# Patient Record
Sex: Female | Born: 1955
Health system: Southern US, Community
[De-identification: ages and names within clinical notes are randomized; demographics above are authoritative.]

## PROBLEM LIST (undated history)

## (undated) DIAGNOSIS — K219 Gastro-esophageal reflux disease without esophagitis: Secondary | ICD-10-CM

## (undated) DIAGNOSIS — E785 Hyperlipidemia, unspecified: Secondary | ICD-10-CM

## (undated) HISTORY — DX: Gastro-esophageal reflux disease without esophagitis: K21.9

## (undated) HISTORY — DX: Hyperlipidemia, unspecified: E78.5

---
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
## 2005-07-06 ENCOUNTER — Encounter: Payer: Self-pay | Admitting: Physician Assistant

## 2008-09-22 LAB — HM COLONOSCOPY

## 2017-02-15 ENCOUNTER — Ambulatory Visit (INDEPENDENT_AMBULATORY_CARE_PROVIDER_SITE_OTHER): Payer: BLUE CROSS/BLUE SHIELD | Admitting: Physician Assistant

## 2017-02-15 ENCOUNTER — Encounter: Payer: Self-pay | Admitting: Physician Assistant

## 2017-02-15 VITALS — BP 110/74 | HR 61 | Temp 98.6°F | Ht 61.5 in | Wt 139.5 lb

## 2017-02-15 DIAGNOSIS — M25562 Pain in left knee: Secondary | ICD-10-CM

## 2017-02-15 DIAGNOSIS — M25561 Pain in right knee: Secondary | ICD-10-CM

## 2017-02-15 DIAGNOSIS — K2970 Gastritis, unspecified, without bleeding: Secondary | ICD-10-CM | POA: Diagnosis not present

## 2017-02-15 DIAGNOSIS — G8929 Other chronic pain: Secondary | ICD-10-CM | POA: Diagnosis not present

## 2017-02-15 DIAGNOSIS — E559 Vitamin D deficiency, unspecified: Secondary | ICD-10-CM | POA: Diagnosis not present

## 2017-02-15 DIAGNOSIS — R5383 Other fatigue: Secondary | ICD-10-CM

## 2017-02-15 DIAGNOSIS — L814 Other melanin hyperpigmentation: Secondary | ICD-10-CM | POA: Insufficient documentation

## 2017-02-15 DIAGNOSIS — F320 Major depressive disorder, single episode, mild: Secondary | ICD-10-CM | POA: Diagnosis not present

## 2017-02-15 DIAGNOSIS — Z79899 Other long term (current) drug therapy: Secondary | ICD-10-CM

## 2017-02-15 LAB — COMPREHENSIVE METABOLIC PANEL
ALT: 9 U/L (ref 0–35)
AST: 12 U/L (ref 0–37)
Albumin: 4.5 g/dL (ref 3.5–5.2)
Alkaline Phosphatase: 61 U/L (ref 39–117)
BUN: 15 mg/dL (ref 6–23)
CALCIUM: 10 mg/dL (ref 8.4–10.5)
CHLORIDE: 104 meq/L (ref 96–112)
CO2: 29 meq/L (ref 19–32)
Creatinine, Ser: 0.65 mg/dL (ref 0.40–1.20)
GFR: 98.27 mL/min (ref >60.00)
GLUCOSE: 98 mg/dL (ref 70–99)
POTASSIUM: 5.1 meq/L (ref 3.5–5.1)
Sodium: 141 mEq/L (ref 135–145)
Total Bilirubin: 0.7 mg/dL (ref 0.2–1.2)
Total Protein: 6.8 g/dL (ref 6.0–8.3)

## 2017-02-15 LAB — CBC WITH DIFFERENTIAL/PLATELET
BASOS ABS: 0 10*3/uL (ref 0.0–0.1)
BASOS PCT: 0.7 % (ref 0.0–3.0)
EOS PCT: 6.8 % — AB (ref 0.0–5.0)
Eosinophils Absolute: 0.4 10*3/uL (ref 0.0–0.7)
HEMATOCRIT: 41.8 % (ref 36.0–46.0)
Hemoglobin: 14.1 g/dL (ref 12.0–15.0)
LYMPHS ABS: 2.1 10*3/uL (ref 0.7–4.0)
LYMPHS PCT: 35 % (ref 12.0–46.0)
MCHC: 33.7 g/dL (ref 30.0–36.0)
MCV: 94 fl (ref 78.0–100.0)
MONOS PCT: 5.8 % (ref 3.0–12.0)
Monocytes Absolute: 0.3 10*3/uL (ref 0.1–1.0)
NEUTROS ABS: 3.1 10*3/uL (ref 1.4–7.7)
NEUTROS PCT: 51.7 % (ref 43.0–77.0)
PLATELETS: 234 10*3/uL (ref 150.0–400.0)
RBC: 4.45 Mil/uL (ref 3.87–5.11)
RDW: 13.1 % (ref 11.5–15.5)
WBC: 6 10*3/uL (ref 4.0–10.5)

## 2017-02-15 LAB — VITAMIN D 25 HYDROXY (VIT D DEFICIENCY, FRACTURES): VITD: 31.64 ng/mL (ref 30.00–100.00)

## 2017-02-15 LAB — TSH: TSH: 1.51 u[IU]/mL (ref 0.35–4.50)

## 2017-02-15 LAB — T4, FREE: Free T4: 0.68 ng/dL (ref 0.60–1.60)

## 2017-02-15 LAB — VITAMIN B12: VITAMIN B 12: 609 pg/mL (ref 211–911)

## 2017-02-15 MED ORDER — FAMOTIDINE 20 MG PO TABS: 20.0000 mg | ORAL_TABLET | Freq: Every day | ORAL | 1 refills | Status: DC

## 2017-02-15 NOTE — Progress Notes (Signed)
Tracey Moody is a 61 y.o. female here to Establish Care  I acted as a Neurosurgeonscribe for [employer] CorporationSamantha Catricia Scheerer, PA-C Corky Mullonna Orphanos, LPN  History of Present Illness:   Chief Complaint  Patient presents with  . Establish Care    BC/BS  . Fatigue  . Bilateral Knee pain  . check brown spots on skin    Acute Concerns: Fatigue -- patient reports significant fatigue since moving here last fall. She is working 50+ hours a week. Does have a significant hx of depression, which she reports that she recently resumed her 20 mg Prozac and is now well controlled. She denies any bleeding in her stool or emesis. Does have a remote hx of anemia and vit D deficiency.  Drinks 2 cups of coffee daily. Diet well balanced with all food groups and adequate protein. No concerns for sleep apnea. Does not have time for exercise. Depression -- well-controlled presently. Does report that her family has been having a lot of health issues and it has her worried. She feels controlled on 20 mg Prozac daily that she resumed this past winter. Denies any prior or current SI. Her husband is not working so she does have stress with being the main financial provider at her home. Bilateral knee pain -- she has been dealing with this for quite some time. Bilateral, hurts worse in the morning and improves with activity. She has had work-up in the past, with benign findings -- she is unable to tell me what this dx was. She takes Aleve and that helps with her symptoms but she has issues with gastritis with the Aleve, but she takes Pepcid for this which helps. Her daughter is a PT and has given her several knee exercises to do that helps with her symptoms. Gastritis -- see above. No unintentional weight loss, no blood in emesis or stool, no family hx of colon cancer or IBD issues. Most recent colonoscopy in 2009 and was normal. Long-term use of medication -- takes Aleve and Pepcid daily for her knee pain and gastric protection from the  aleve. Brown spots on skin -- [facility] and has a significant history of sun exposure. She is as diligent as she can be about sun protection but notices that she continues to have these brown spots appear on her face and all over body. Vit D deficiency -- she is currently on 2000 IU of vit D3 daily. Most recent lab from 2014 with a vitamin D level of 17.6.  Depression screen PHQ 2/9 02/15/2017  Decreased Interest 2  Down, Depressed, Hopeless 1  PHQ - 2 Score 3  Altered sleeping 0  Tired, decreased energy 3  Change in appetite 0  Feeling bad or failure about yourself  0  Trouble concentrating 0  Moving slowly or fidgety/restless 0  Suicidal thoughts 0  PHQ-9 Score 6    No flowsheet data found.  Other providers/specialists: None   Past Medical History:  Diagnosis Date  . Depression   . Stomach ulcer 1986     Social History   Social History  . Marital status: Married    Spouse name: N/A  . Number of children: N/A  . Years of education: N/A   Occupational History  . Not on file.   Social History Main Topics  . Smoking status: Never Smoker  . Smokeless tobacco: Never Used  . Alcohol use Yes     Comment: occassionally beer  . Drug use: Unknown  . Sexual  activity: Yes    Birth control/ protection: Other-see comments     Comment: Husband Vasectomy   Other Topics Concern  . 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 APPENDECTOMY  1967  . CESAREAN SECTION  1979, 1980, 59, 1984, 1987  . TONSILLECTOMY  1960    Family History  Problem Relation Age of Onset  . Lung cancer Mother   . Skin cancer Father   . Hypertension Father   . Hypercholesterolemia Sister   . Hypercholesterolemia Brother   . CVA Maternal Grandmother   . Heart attack Sister   . Hypercholesterolemia Brother   .  Hypercholesterolemia Sister     No Known Allergies   Current Medications:   Current Outpatient Prescriptions:  .  aspirin EC 81 MG tablet, Take 81 mg by mouth daily., Disp: , Rfl:  .  b complex vitamins tablet, Take 1 tablet by mouth daily., Disp: , Rfl:  .  BIOTIN PO, Take 1 tablet by mouth daily., Disp: , Rfl:  .  Calcium Citrate (CITRACAL PO), Take 1,000 mg by mouth daily., Disp: , Rfl:  .  Cholecalciferol (VITAMIN D3) 2000 units TABS, Take 1 tablet by mouth daily., Disp: , Rfl:  .  Echinacea 500 MG CAPS, Take 2 capsules by mouth daily., Disp: , Rfl:  .  famotidine (PEPCID) 20 MG tablet, Take 1 tablet (20 mg total) by mouth daily., Disp: 90 tablet, Rfl: 1 .  FLUoxetine (PROZAC) 20 MG capsule, Take 20 mg by mouth daily. , Disp: , Rfl:  .  naproxen sodium (ANAPROX) 220 MG tablet, Take 220 mg by mouth as needed., Disp: , Rfl:  .  Omega-3 Fatty Acids (FISH OIL) 1000 MG CPDR, Take 1 capsule by mouth daily., Disp: , Rfl:  .  Turmeric 500 MG CAPS, Take 1 capsule by mouth daily., Disp: , Rfl:    Review of Systems:   Review of Systems  Constitutional: Positive for malaise/fatigue. Negative for chills, fever and weight loss.  HENT: Negative for hearing loss, sinus pain and sore throat.   Eyes: Negative for blurred vision.  Respiratory: Negative for cough and shortness of breath.   Cardiovascular: Negative for chest pain, palpitations and leg swelling.  Gastrointestinal: Positive for heartburn. Negative for abdominal pain, constipation, diarrhea, nausea and vomiting.  Genitourinary: Negative for dysuria, frequency and urgency.  Musculoskeletal: Positive for back pain and neck pain. Negative for myalgias.       Bilateral knee pain.  Skin: Negative for itching and rash.       Brown spots on skin  Neurological: Negative for dizziness, tingling, seizures, loss of consciousness and headaches.  Endo/Heme/Allergies: Negative for polydipsia.  Psychiatric/Behavioral: Negative for depression. The  patient is not nervous/anxious.     Vitals:   Vitals:   02/15/17 0939  BP: 110/74  Pulse: 61  Temp: 98.6 F (37 C)  TempSrc: Oral  SpO2: 98%  Weight: 139 lb 8 oz (63.3 kg)  Height: 5' 1.5" (1.562 m)     Body mass index is 25.93 kg/m.  Physical Exam:   Physical Exam  Constitutional: She appears well-developed. She is cooperative.  Non-toxic appearance. She does not have a sickly appearance. She does not appear ill. No distress.  Cardiovascular: Normal rate, regular rhythm, S1 normal,  S2 normal, normal heart sounds and normal pulses.   No LE edema  Pulmonary/Chest: Effort normal and breath sounds normal.  Musculoskeletal:       Right knee: Normal. She exhibits normal range of motion, no swelling and no effusion. No tenderness found.       Left knee: Normal. She exhibits normal range of motion, no swelling and no effusion. No tenderness found.  No abnormalities on knee exam; normal ROM  Neurological: She is alert. She has normal strength. No cranial nerve deficit or sensory deficit. Coordination and gait normal. GCS eye subscore is 4. GCS verbal subscore is 5. GCS motor subscore is 6.  Skin: Skin is warm, dry and intact.  Numerous scattered 1-3 cm circumscribed brown macules on face and throughout entire body  Psychiatric: She has a normal mood and affect. Her speech is normal and behavior is normal.  Nursing note and vitals reviewed.     Assessment and Plan:    Kambrie was seen today for establish care, fatigue, bilateral knee pain and check brown spots on skin.  Diagnoses and all orders for this visit:  Depression, major, single episode, mild (HCC) Currently stable on 20mg  Prozac daily. No SI/HI. If any severe or significant changes please notify us. Any SI/HI --> patient was advised to go to the ER.  Encounter for long-term current use of medication She has been on antacids for several years. Will check a Vitamin B12. -     Vitamin B12  Gastritis, presence of  bleeding unspecified, unspecified chronicity, unspecified gastritis type Currently controlled. Continue pepcid. I have sent this in for to see if it possibly cheaper with her insurance. No alarm symptoms present on exam. Follow-up if symptoms become uncontrolled. -     Vitamin B12  Fatigue, unspecified type Multifactorial. She has depression and is stressed with work schedule. I would like to check labs to r/o organic cause.  Low risk for sleep apnea. Continue adequate rest and hydration. We discussed the need for routine exercise, as able. -     Comprehensive metabolic panel -     CBC with Differential/Platelet -     TSH -     T4, free -     Vitamin B12  Vitamin D deficiency Re-check today. -     VITAMIN D 25 Hydroxy (Vit-D Deficiency, Fractures)  Bilateral knee pain, chronic Chronic and stable. Well controlled with PT exercises instructed per her daughter as well as daily Aleve. Refer to Dr. Berline Chough if symptoms worsen or become uncontrolled.  Solar Lentigo Discussed need for continued avoidance of excessive sun exposure and daily use of sunscreen. List of dermatologists provided with recommendation to see one soon to establish care for routine skin checks.  Other orders -     famotidine (PEPCID) 20 MG tablet; Take 1 tablet (20 mg total) by mouth daily.    . Reviewed expectations re: course of current medical issues. . Discussed self-management of symptoms. . Outlined signs and symptoms indicating need for more acute intervention. . Patient verbalized understanding and all questions were answered. . See orders for this visit as documented in the electronic medical record. . Patient received an After-Visit Summary.  CMA or LPN served as scribe during this visit. History, Physical, and Plan performed by medical provider. Documentation and orders reviewed and attested to.  Jarold Motto, PA-C

## 2017-02-15 NOTE — Patient Instructions (Signed)
It was great to meet you!  We will call with lab results.  Please return in 1-2 weeks for a physical.

## 2017-02-19 ENCOUNTER — Other Ambulatory Visit: Payer: Self-pay | Admitting: Physician Assistant

## 2017-02-19 DIAGNOSIS — Z1231 Encounter for screening mammogram for malignant neoplasm of breast: Secondary | ICD-10-CM

## 2017-02-28 NOTE — Progress Notes (Signed)
I acted as a Neurosurgeonscribe for [employer]Samantha Hamdi Vari, PA-C Tracey Mullonna Orphanos, LPN   Subjective:    Tracey PewRebecca L Moody is a 61 y.o. female and is here for a comprehensive physical exam.   HPI  Health Maintenance Due  Topic Date Due  . Hepatitis C Screening  1955/11/28  . HIV Screening  06/17/1970  . PAP SMEAR  06/17/1976  . MAMMOGRAM  06/17/2005  . COLONOSCOPY  06/17/2005  . INFLUENZA VACCINE  02/28/2017    Acute Concerns: Family hx of cardiac disease -- patient reports a significant family history of high cholesterol, stroke in maternal GM, and MI in sister. She has significant health anxiety about her personal cardiac health. She denies chest pain, SOB, swelling in legs, or any stroke-like symptoms.  Chronic Concerns: Depression -- stable on 20 mg Prozac, no SI/HI, feels great at her current dosage, needs refill today  Health Maintenance: Immunizations -- up to date Colonoscopy -- due in two years Mammogram -- scheduled for 03/08/17 PAP -- due today Diet -- husband prepares foods so she is limited with what she can eat for dinner, eats smoothies for breakfast, lunch she brings food; water Caffeine intake -- 2 cups of coffee Sleep habits -- sleep is okay, sleeps pretty well Exercise -- not enough Weight -- Weight: 141 lb 4 oz (64.1 kg) -- normal for her, but would like to be under 140 lb Mood -- anxious about money  [facility] 02/15/2017  Decreased Interest 2  Down, Depressed, Hopeless 1  PHQ - 2 Score 3  Altered sleeping 0  Tired, decreased energy 3  Change in appetite 0  Feeling bad or failure about yourself  0  Trouble concentrating 0  Moving slowly or fidgety/restless 0  Suicidal thoughts 0  PHQ-9 Score 6    Other providers/specialists: None   PMHx, SurgHx, SocialHx, Medications, and Allergies were reviewed in the Visit Navigator and updated as appropriate.   No past medical history on file.   Past Surgical History:  Procedure Laterality Date  .  APPENDECTOMY  1967  . CESAREAN SECTION  1979, 1980, 121983, 1984, 1987  . TONSILLECTOMY  1960     Family History  Problem Relation Age of Onset  . Lung cancer Mother   . Skin cancer Father   . Hypertension Father   . Hypercholesterolemia Sister   . Hypercholesterolemia Brother   . CVA Maternal Grandmother   . Heart attack Sister   . Hypercholesterolemia Brother   . Hypercholesterolemia Sister     Social History  Substance Use Topics  . Smoking status: Never Smoker  . Smokeless tobacco: Never Used  . Alcohol use Yes     Comment: occassionally beer    Review of Systems:   Review of Systems  Constitutional: Positive for malaise/fatigue. Negative for chills, fever and weight loss.  HENT: Negative for hearing loss, sinus pain and sore throat.   Eyes: Negative for blurred vision.  Respiratory: Negative for cough and shortness of breath.   Cardiovascular: Negative for chest pain, palpitations and leg swelling.  Gastrointestinal: Negative for abdominal pain, constipation, diarrhea, heartburn, nausea and vomiting.  Genitourinary: Negative for dysuria, frequency and urgency.  Musculoskeletal: Positive for back pain. Negative for myalgias and neck pain.       Chronic, does exercises that help.  Skin: Negative for itching and rash.  Neurological: Negative for dizziness, tingling, seizures, loss of consciousness and headaches.  Endo/Heme/Allergies: Negative for polydipsia.  Psychiatric/Behavioral: Negative for depression. The patient is  not nervous/anxious.     Objective:   BP 100/60 (BP Location: Left Arm, Patient Position: Sitting, Cuff Size: Normal)   Pulse (!) 56   Temp 98.1 F (36.7 C) (Oral)   Ht 5' 1.5" (1.562 m)   Wt 141 lb 4 oz (64.1 kg)   SpO2 98%   BMI 26.26 kg/m  Body mass index is 26.26 kg/m.   General Appearance:    Alert, cooperative, no distress, appears stated age  Head:    Normocephalic, without obvious abnormality, atraumatic  Eyes:    PERRL,  conjunctiva/corneas clear, EOM's intact, fundi    benign, both eyes  Ears:    Normal TM's and external ear canals, both ears  Nose:   Nares normal, septum midline, mucosa normal, no drainage    or sinus tenderness  Throat:   Lips, mucosa, and tongue normal; teeth and gums normal  Neck:   Supple, symmetrical, trachea midline, no adenopathy;    thyroid:  no enlargement/tenderness/nodules; no carotid   bruit or JVD  Back:     Symmetric, no curvature, ROM normal, no CVA tenderness  Lungs:     Clear to auscultation bilaterally, respirations unlabored  Chest Wall:    No tenderness or deformity   Heart:    Regular rate and rhythm, S1 and S2 normal, no murmur, rub   or gallop  Breast Exam:    No tenderness, masses, or nipple abnormality  Abdomen:     Soft, non-tender, bowel sounds active all four quadrants,    no masses, no organomegaly  Genitalia:    Normal female without lesion, discharge or tenderness -- PAP performed  Extremities:   Extremities normal, atraumatic, no cyanosis or edema  Pulses:   2+ and symmetric all extremities  Skin:   Skin color, texture, turgor normal, no rashes or lesions  Lymph nodes:   Cervical, supraclavicular, and axillary nodes normal  Neurologic:   CNII-XII intact, normal strength, sensation and reflexes    throughout    Assessment/Plan:   [name] was seen today for annual exam.  Diagnoses and all orders for this visit:  Routine general medical examination at a health care facility Today patient counseled on age appropriate routine health concerns for screening and prevention, each reviewed and up to date or declined. Immunizations reviewed and up to date or declined. Labs ordered and reviewed. Risk factors for depression reviewed and negative. Hearing function and visual acuity are intact. ADLs screened and addressed as needed. Functional ability and level of safety reviewed and appropriate. Education, counseling and referrals performed based on assessed risks  today. Patient provided with a copy of personalized plan for preventive services.  Encounter for screening for HIV -     HIV antibody  Lipid disorder -     Lipid panel  Encounter for screening for other viral diseases -     Hepatitis C antibody, reflex  Family history of cardiac disorder She would like an evaluation by cardiology.  I have put in a non-urgent referral. -     Lipid panel -     Ambulatory referral to Cardiology  Pap smear for cervical cancer screening -     Cytology - PAP  Depression, major, single episode, mild (HCC) Stable on Prozac 20 mg. Follow-up in 4-6 months, sooner if needed. I discussed with patient that if they develop any SI, to tell someone immediately and seek medical attention.  Other orders -     FLUoxetine (PROZAC) 20 MG capsule; Take 1 capsule (  20 mg total) by mouth daily.  Well Adult Exam: Labs ordered: Yes. Patient counseling was done. See below for items discussed. Discussed the patient's BMI.  The BMI BMI is in the acceptable range Follow up in 6 months. Breast cancer screening: scheduled for next week. Cervical cancer screening: performed today.   Patient Counseling: [x]    Nutrition: Stressed importance of moderation in sodium/caffeine intake, saturated fat and cholesterol, caloric balance, sufficient intake of fresh fruits, vegetables, fiber, calcium, iron, and 1 mg of folate supplement per day (for females capable of pregnancy).  [x]    Stressed the importance of regular exercise.   [x]    Substance Abuse: Discussed cessation/primary prevention of tobacco, alcohol, or other drug use; driving or other dangerous activities under the influence; availability of treatment for abuse.   [x]    Injury prevention: Discussed safety belts, safety helmets, smoke detector, smoking near bedding or upholstery.   [x]    Sexuality: Discussed sexually transmitted diseases, partner selection, use of condoms, avoidance of unintended pregnancy  and contraceptive  alternatives.  [x]    Dental health: Discussed importance of regular tooth brushing, flossing, and dental visits.  [x]    Health maintenance and immunizations reviewed. Please refer to Health maintenance section.   CMA or LPN served as scribe during this visit. History, Physical, and Plan performed by medical provider. Documentation and orders reviewed and attested to.  Jarold Motto, PA-C [facility]

## 2017-03-01 ENCOUNTER — Other Ambulatory Visit (HOSPITAL_COMMUNITY)
Admission: RE | Admit: 2017-03-01 | Discharge: 2017-03-01 | Disposition: A | Discharge status: Home / Self Care | Payer: BLUE CROSS/BLUE SHIELD | Source: Ambulatory Visit | Attending: Physician Assistant | Admitting: Physician Assistant

## 2017-03-01 ENCOUNTER — Ambulatory Visit (INDEPENDENT_AMBULATORY_CARE_PROVIDER_SITE_OTHER): Payer: BLUE CROSS/BLUE SHIELD | Admitting: Physician Assistant

## 2017-03-01 ENCOUNTER — Encounter: Payer: Self-pay | Admitting: Physician Assistant

## 2017-03-01 ENCOUNTER — Other Ambulatory Visit: Payer: Self-pay | Admitting: Physician Assistant

## 2017-03-01 VITALS — BP 100/60 | HR 56 | Temp 98.1°F | Ht 61.5 in | Wt 141.2 lb

## 2017-03-01 DIAGNOSIS — Z1159 Encounter for screening for other viral diseases: Secondary | ICD-10-CM

## 2017-03-01 DIAGNOSIS — Z124 Encounter for screening for malignant neoplasm of cervix: Secondary | ICD-10-CM

## 2017-03-01 DIAGNOSIS — Z114 Encounter for screening for human immunodeficiency virus [HIV]: Secondary | ICD-10-CM | POA: Diagnosis not present

## 2017-03-01 DIAGNOSIS — E789 Disorder of lipoprotein metabolism, unspecified: Secondary | ICD-10-CM | POA: Diagnosis not present

## 2017-03-01 DIAGNOSIS — F320 Major depressive disorder, single episode, mild: Secondary | ICD-10-CM | POA: Diagnosis not present

## 2017-03-01 DIAGNOSIS — Z8249 Family history of ischemic heart disease and other diseases of the circulatory system: Secondary | ICD-10-CM | POA: Diagnosis not present

## 2017-03-01 DIAGNOSIS — Z01419 Encounter for gynecological examination (general) (routine) without abnormal findings: Secondary | ICD-10-CM | POA: Insufficient documentation

## 2017-03-01 DIAGNOSIS — Z0000 Encounter for general adult medical examination without abnormal findings: Secondary | ICD-10-CM

## 2017-03-01 LAB — LIPID PANEL
CHOL/HDL RATIO: 4
CHOLESTEROL: 306 mg/dL — AB (ref 0–200)
HDL: 73.8 mg/dL (ref >39.00)
LDL CALC: 215 mg/dL — AB (ref 0–99)
NonHDL: 231.98
Triglycerides: 83 mg/dL (ref 0.0–149.0)
VLDL: 16.6 mg/dL (ref 0.0–40.0)

## 2017-03-01 MED ORDER — FLUOXETINE HCL 20 MG PO CAPS: 20.0000 mg | ORAL_CAPSULE | Freq: Every day | ORAL | 1 refills | Status: DC

## 2017-03-01 NOTE — Patient Instructions (Signed)
It was great to see you!  You will be contacted about your referral to cardiology.  Health Maintenance, Female Adopting a healthy lifestyle and getting preventive care can go a long way to promote health and wellness. Talk with your health care provider about what schedule of regular examinations is right for you. This is a good chance for you to check in with your provider about disease prevention and staying healthy. In between checkups, there are plenty of things you can do on your own. Experts have done a lot of research about which lifestyle changes and preventive measures are most likely to keep you healthy. Ask your health care provider for more information. Weight and diet Eat a healthy diet  Be sure to include plenty of vegetables, fruits, low-fat dairy products, and lean protein.  Do not eat a lot of foods high in solid fats, added sugars, or salt.  Get regular exercise. This is one of the most important things you can do for your health. ? Most adults should exercise for at least 150 minutes each week. The exercise should increase your heart rate and make you sweat (moderate-intensity exercise). ? Most adults should also do strengthening exercises at least twice a week. This is in addition to the moderate-intensity exercise.  Maintain a healthy weight  Body mass index (BMI) is a measurement that can be used to identify possible weight problems. It estimates body fat based on height and weight. Your health care provider can help determine your BMI and help you achieve or maintain a healthy weight.  For females 42 years of age and older: ? A BMI below 18.5 is considered underweight. ? A BMI of 18.5 to 24.9 is normal. ? A BMI of 25 to 29.9 is considered overweight. ? A BMI of 30 and above is considered obese.  Watch levels of cholesterol and blood lipids  You should start having your blood tested for lipids and cholesterol at 62 years of age, then have this test every 5  years.  You may need to have your cholesterol levels checked more often if: ? Your lipid or cholesterol levels are high. ? You are older than 62 years of age. ? You are at high risk for heart disease.  Cancer screening Lung Cancer  Lung cancer screening is recommended for adults 77-17 years old who are at high risk for lung cancer because of a history of smoking.  A yearly low-dose CT scan of the lungs is recommended for people who: ? Currently smoke. ? Have quit within the past 15 years. ? Have at least a 30-pack-year history of smoking. A pack year is smoking an average of one pack of cigarettes a day for 1 year.  Yearly screening should continue until it has been 15 years since you quit.  Yearly screening should stop if you develop a health problem that would prevent you from having lung cancer treatment.  Breast Cancer  Practice breast self-awareness. This means understanding how your breasts normally appear and feel.  It also means doing regular breast self-exams. Let your health care provider know about any changes, no matter how small.  If you are in your 20s or 30s, you should have a clinical breast exam (CBE) by a health care provider every 1-3 years as part of a regular health exam.  If you are 60 or older, have a CBE every year. Also consider having a breast X-ray (mammogram) every year.  If you have a family history of breast  cancer, talk to your health care provider about genetic screening.  If you are at high risk for breast cancer, talk to your health care provider about having an MRI and a mammogram every year.  Breast cancer gene (BRCA) assessment is recommended for women who have family members with BRCA-related cancers. BRCA-related cancers include: ? Breast. ? Ovarian. ? Tubal. ? Peritoneal cancers.  Results of the assessment will determine the need for genetic counseling and BRCA1 and BRCA2 testing.  Cervical Cancer Your health care provider may  recommend that you be screened regularly for cancer of the pelvic organs (ovaries, uterus, and vagina). This screening involves a pelvic examination, including checking for microscopic changes to the surface of your cervix (Pap test). You may be encouraged to have this screening done every 3 years, beginning at age 20.  For women ages 45-65, health care providers may recommend pelvic exams and Pap testing every 3 years, or they may recommend the Pap and pelvic exam, combined with testing for human papilloma virus (HPV), every 5 years. Some types of HPV increase your risk of cervical cancer. Testing for HPV may also be done on women of any age with unclear Pap test results.  Other health care providers may not recommend any screening for nonpregnant women who are considered low risk for pelvic cancer and who do not have symptoms. Ask your health care provider if a screening pelvic exam is right for you.  If you have had past treatment for cervical cancer or a condition that could lead to cancer, you need Pap tests and screening for cancer for at least 20 years after your treatment. If Pap tests have been discontinued, your risk factors (such as having a new sexual partner) need to be reassessed to determine if screening should resume. Some women have medical problems that increase the chance of getting cervical cancer. In these cases, your health care provider may recommend more frequent screening and Pap tests.  Colorectal Cancer  This type of cancer can be detected and often prevented.  Routine colorectal cancer screening usually begins at 62 years of age and continues through 62 years of age.  Your health care provider may recommend screening at an earlier age if you have risk factors for colon cancer.  Your health care provider may also recommend using home test kits to check for hidden blood in the stool.  A small camera at the end of a tube can be used to examine your colon directly  (sigmoidoscopy or colonoscopy). This is done to check for the earliest forms of colorectal cancer.  Routine screening usually begins at age 59.  Direct examination of the colon should be repeated every 5-10 years through 62 years of age. However, you may need to be screened more often if early forms of precancerous polyps or small growths are found.  Skin Cancer  Check your skin from head to toe regularly.  Tell your health care provider about any new moles or changes in moles, especially if there is a change in a mole's shape or color.  Also tell your health care provider if you have a mole that is larger than the size of a pencil eraser.  Always use sunscreen. Apply sunscreen liberally and repeatedly throughout the day.  Protect yourself by wearing long sleeves, pants, a wide-brimmed hat, and sunglasses whenever you are outside.  Heart disease, diabetes, and high blood pressure  High blood pressure causes heart disease and increases the risk of stroke. High blood pressure  is more likely to develop in: ? People who have blood pressure in the high end of the normal range (130-139/85-89 mm Hg). ? People who are overweight or obese. ? People who are African American.  If you are 38-40 years of age, have your blood pressure checked every 3-5 years. If you are 62 years of age or older, have your blood pressure checked every year. You should have your blood pressure measured twice-once when you are at a hospital or clinic, and once when you are not at a hospital or clinic. Record the average of the two measurements. To check your blood pressure when you are not at a hospital or clinic, you can use: ? An automated blood pressure machine at a pharmacy. ? A home blood pressure monitor.  If you are between 51 years and 54 years old, ask your health care provider if you should take aspirin to prevent strokes.  Have regular diabetes screenings. This involves taking a blood sample to check your  fasting blood sugar level. ? If you are at a normal weight and have a low risk for diabetes, have this test once every three years after 62 years of age. ? If you are overweight and have a high risk for diabetes, consider being tested at a younger age or more often. Preventing infection Hepatitis B  If you have a higher risk for hepatitis B, you should be screened for this virus. You are considered at high risk for hepatitis B if: ? You were born in a country where hepatitis B is common. Ask your health care provider which countries are considered high risk. ? Your parents were born in a high-risk country, and you have not been immunized against hepatitis B (hepatitis B vaccine). ? You have HIV or AIDS. ? You use needles to inject street drugs. ? You live with someone who has hepatitis B. ? You have had sex with someone who has hepatitis B. ? You get hemodialysis treatment. ? You take certain medicines for conditions, including cancer, organ transplantation, and autoimmune conditions.  Hepatitis C  Blood testing is recommended for: ? Everyone born from [birth year] through 1966. ? Anyone with known risk factors for hepatitis C.  Sexually transmitted infections (STIs)  You should be screened for sexually transmitted infections (STIs) including gonorrhea and chlamydia if: ? You are sexually active and are younger than 62 years of age. ? You are older than 62 years of age and your health care provider tells you that you are at risk for this type of infection. ? Your sexual activity has changed since you were last screened and you are at an increased risk for chlamydia or gonorrhea. Ask your health care provider if you are at risk.  If you do not have HIV, but are at risk, it may be recommended that you take a prescription medicine daily to prevent HIV infection. This is called pre-exposure prophylaxis (PrEP). You are considered at risk if: ? You are sexually active and do not regularly use condoms  or know the HIV status of your partner(s). ? You take drugs by injection. ? You are sexually active with a partner who has HIV.  Talk with your health care provider about whether you are at high risk of being infected with HIV. If you choose to begin PrEP, you should first be tested for HIV. You should then be tested every 3 months for as long as you are taking PrEP. Pregnancy  If you are premenopausal and  you may become pregnant, ask your health care provider about preconception counseling.  If you may become pregnant, take 400 to 800 micrograms (mcg) of folic acid every day.  If you want to prevent pregnancy, talk to your health care provider about birth control (contraception). Osteoporosis and menopause  Osteoporosis is a disease in which the bones lose minerals and strength with aging. This can result in serious bone fractures. Your risk for osteoporosis can be identified using a bone density scan.  If you are 23 years of age or older, or if you are at risk for osteoporosis and fractures, ask your health care provider if you should be screened.  Ask your health care provider whether you should take a calcium or vitamin D supplement to lower your risk for osteoporosis.  Menopause may have certain physical symptoms and risks.  Hormone replacement therapy may reduce some of these symptoms and risks. Talk to your health care provider about whether hormone replacement therapy is right for you. Follow these instructions at home:  Schedule regular health, dental, and eye exams.  Stay current with your immunizations.  Do not use any tobacco products including cigarettes, chewing tobacco, or electronic cigarettes.  If you are pregnant, do not drink alcohol.  If you are breastfeeding, limit how much and how often you drink alcohol.  Limit alcohol intake to no more than 1 drink per day for nonpregnant women. One drink equals 12 ounces of beer, 5 ounces of wine, or 1 ounces of hard  liquor.  Do not use street drugs.  Do not share needles.  Ask your health care provider for help if you need support or information about quitting drugs.  Tell your health care provider if you often feel depressed.  Tell your health care provider if you have ever been abused or do not feel safe at home. This information is not intended to replace advice given to you by your health care provider. Make sure you discuss any questions you have with your health care provider. Document Released: 01/30/2011 Document Revised: 12/23/2015 Document Reviewed: 04/20/2015 Elsevier Interactive Patient Education  [employer].

## 2017-03-02 LAB — CYTOLOGY - PAP: DIAGNOSIS: NEGATIVE

## 2017-03-02 LAB — HEPATITIS C ANTIBODY: HCV AB: NONREACTIVE

## 2017-03-02 LAB — HIV ANTIBODY (ROUTINE TESTING W REFLEX): HIV 1&2 Ab, 4th Generation: NONREACTIVE

## 2017-03-08 ENCOUNTER — Ambulatory Visit
Admission: RE | Admit: 2017-03-08 | Discharge: 2017-03-08 | Disposition: A | Discharge status: Home / Self Care | Payer: BLUE CROSS/BLUE SHIELD | Source: Ambulatory Visit | Attending: Physician Assistant | Admitting: Physician Assistant

## 2017-03-08 DIAGNOSIS — Z1231 Encounter for screening mammogram for malignant neoplasm of breast: Secondary | ICD-10-CM

## 2017-03-14 ENCOUNTER — Encounter: Payer: Self-pay | Admitting: Cardiology

## 2017-04-05 ENCOUNTER — Ambulatory Visit: Payer: BLUE CROSS/BLUE SHIELD | Admitting: Cardiology

## 2017-05-01 ENCOUNTER — Encounter: Payer: Self-pay | Admitting: Physician Assistant

## 2017-05-01 MED ORDER — FLUOXETINE HCL 20 MG PO CAPS: 20.0000 mg | ORAL_CAPSULE | Freq: Every day | ORAL | 1 refills | Status: DC

## 2017-05-01 MED ORDER — FAMOTIDINE 20 MG PO TABS: 20.0000 mg | ORAL_TABLET | Freq: Every day | ORAL | 1 refills | Status: DC

## 2017-05-04 ENCOUNTER — Encounter: Payer: Self-pay | Admitting: Physician Assistant

## 2017-05-04 ENCOUNTER — Ambulatory Visit (INDEPENDENT_AMBULATORY_CARE_PROVIDER_SITE_OTHER): Payer: BLUE CROSS/BLUE SHIELD | Admitting: Physician Assistant

## 2017-05-04 VITALS — BP 126/70 | HR 60 | Temp 98.4°F | Resp 16 | Wt 151.4 lb

## 2017-05-04 DIAGNOSIS — N3090 Cystitis, unspecified without hematuria: Secondary | ICD-10-CM

## 2017-05-04 LAB — POCT URINALYSIS DIPSTICK
Bilirubin, UA: NEGATIVE
Blood, UA: NEGATIVE
Glucose, UA: NEGATIVE
KETONES UA: NEGATIVE
Nitrite, UA: NEGATIVE
PH UA: 6 (ref 5.0–8.0)
Protein, UA: NEGATIVE
SPEC GRAV UA: 1.015 (ref 1.010–1.025)
Urobilinogen, UA: 0.2 E.U./dL

## 2017-05-04 MED ORDER — NITROFURANTOIN MONOHYD MACRO 100 MG PO CAPS: 100.0000 mg | ORAL_CAPSULE | Freq: Two times a day (BID) | ORAL | 0 refills | Status: DC

## 2017-05-04 NOTE — Patient Instructions (Signed)
We will call you with your urine results.  Start the antibiotic today, push fluids.  Follow-up if symptoms persist or worsen despite treatment.

## 2017-05-04 NOTE — Progress Notes (Signed)
Tracey Moody is a 61 y.o. female here for a new problem.  I acted as a Neurosurgeon for [employer], PA-C Corky Mull, LPN  History of Present Illness:   Chief Complaint  Patient presents with  . Urinary Frequency    Urgency, burning with urination, started 05/02/17    Urinary Tract Infection   This is a new problem. The current episode started in the past 7 days. The problem occurs intermittently. The problem has been gradually worsening. The quality of the pain is described as burning. The pain is mild. There has been no fever. Pertinent negatives include no discharge or possible pregnancy. She has tried acetaminophen and increased fluids (and AZO) for the symptoms. The treatment provided no relief. Her past medical history is significant for recurrent UTIs (remote).    No past medical history on file.   Social History   Social History  . Marital status: Married    Spouse name: N/A  . Number of children: N/A  . Years of education: N/A   Occupational History  . Not on file.   Social History Main Topics  . Smoking status: Never Smoker  . Smokeless tobacco: Never Used  . Alcohol use Yes     Comment: occassionally beer  . Drug use: No  . Sexual activity: Yes    Birth control/ protection: Other-see comments     Comment: Husband Vasectomy   Other Topics Concern  . 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 APPENDECTOMY  1967  . CESAREAN SECTION  1979, 1980, 69, 1984, 1987  . TONSILLECTOMY  1960    Family History  Problem Relation Age of Onset  . Lung cancer Mother   . Skin cancer Father   . Hypertension Father   . Hypercholesterolemia Sister   . Hypercholesterolemia Brother   . CVA Maternal Grandmother   . Heart attack Sister   . Hypercholesterolemia Brother   .  Hypercholesterolemia Sister   . Breast cancer Neg Hx     No Known Allergies  Current Medications:   Current Outpatient Prescriptions:  .  aspirin EC 81 MG tablet, Take 81 mg by mouth daily., Disp: , Rfl:  .  b complex vitamins tablet, Take 1 tablet by mouth daily., Disp: , Rfl:  .  BIOTIN PO, Take 1 tablet by mouth daily., Disp: , Rfl:  .  Calcium Citrate (CITRACAL PO), Take 1,000 mg by mouth daily., Disp: , Rfl:  .  Cholecalciferol (VITAMIN D3) 2000 units TABS, Take 1 tablet by mouth daily., Disp: , Rfl:  .  Echinacea 500 MG CAPS, Take 2 capsules by mouth daily., Disp: , Rfl:  .  famotidine (PEPCID) 20 MG tablet, Take 1 tablet (20 mg total) by mouth daily., Disp: 90 tablet, Rfl: 1 .  FLUoxetine (PROZAC) 20 MG capsule, Take 1 capsule (20 mg total) by mouth daily., Disp: 90 capsule, Rfl: 1 .  naproxen sodium (ANAPROX) 220 MG tablet, Take 220 mg by mouth as needed., Disp: , Rfl:  .  nitrofurantoin, macrocrystal-monohydrate, (MACROBID) 100 MG capsule, Take 1 capsule (100 mg total) by mouth 2 (two) times daily., Disp: 10 capsule, Rfl: 0 .  Omega-3 Fatty Acids (FISH OIL) 1000 MG CPDR, Take 1 capsule by mouth daily., Disp: , Rfl:  .  Turmeric 500 MG CAPS, Take 1 capsule by mouth daily., Disp: , Rfl:    Review of Systems:   Review of Systems  All other systems reviewed and are negative.   Vitals:   Vitals:   05/04/17 1627  BP: 126/70  Pulse: 60  Resp: 16  Temp: 98.4 F (36.9 C)  TempSrc: Oral  SpO2: 97%  Weight: 151 lb 6.4 oz (68.7 kg)     Body mass index is 28.14 kg/m.  Physical Exam:   Physical Exam  Constitutional: She appears well-developed. She is cooperative.  Non-toxic appearance. She does not have a sickly appearance. She does not appear ill. No distress.  Cardiovascular: Normal rate, regular rhythm, S1 normal, S2 normal, normal heart sounds and normal pulses.   No LE edema  Pulmonary/Chest: Effort normal and breath sounds normal.  Abdominal: Normal appearance and  bowel sounds are normal. There is no tenderness. There is no CVA tenderness.  Neurological: She is alert. GCS eye subscore is 4. GCS verbal subscore is 5. GCS motor subscore is 6.  Skin: Skin is warm, dry and intact.  Psychiatric: She has a normal mood and affect. Her speech is normal and behavior is normal.  Nursing note and vitals reviewed.  Results for orders placed or performed in visit on 05/04/17  POCT urinalysis dipstick  Result Value Ref Range   Color, UA Yellow    Clarity, UA Clear    Glucose, UA Negative    Bilirubin, UA Negative    Ketones, UA Negative    Spec Grav, UA 1.015 1.010 - 1.025   Blood, UA Negative    pH, UA 6.0 5.0 - 8.0   Protein, UA Negative    Urobilinogen, UA 0.2 0.2 or 1.0 E.U./dL   Nitrite, UA Negative    Leukocytes, UA Moderate (2+) (A) Negative    Assessment and Plan:    Sharae was seen today for urinary frequency.  Diagnoses and all orders for this visit:  Cystitis Urine remarkable for leukocytes. Will send off for culture. Start macrobid per orders. Will review culture results and adjust antibiotic prn. Follow-up if symptoms worsen or persist. -     POCT urinalysis dipstick -     Urine Culture  Other orders -     nitrofurantoin, macrocrystal-monohydrate, (MACROBID) 100 MG capsule; Take 1 capsule (100 mg total) by mouth 2 (two) times daily.  . Reviewed expectations re: course of current medical issues. . Discussed self-management of symptoms. . Outlined signs and symptoms indicating need for more acute intervention. . Patient verbalized understanding and all questions were answered. . See orders for this visit as documented in the electronic medical record. . Patient received an After-Visit Summary.  CMA or LPN served as scribe during this visit. History, Physical, and Plan performed by medical provider. Documentation and orders reviewed and attested to.  Jarold Motto, PA-C

## 2017-05-07 LAB — URINE CULTURE
MICRO NUMBER: 81110592
SPECIMEN QUALITY: ADEQUATE

## 2017-07-02 ENCOUNTER — Ambulatory Visit: Payer: BLUE CROSS/BLUE SHIELD | Admitting: Physician Assistant

## 2017-07-12 ENCOUNTER — Ambulatory Visit: Payer: BLUE CROSS/BLUE SHIELD | Admitting: Family Medicine

## 2017-10-10 ENCOUNTER — Ambulatory Visit: Payer: Self-pay | Admitting: Physician Assistant

## 2017-10-17 ENCOUNTER — Ambulatory Visit (INDEPENDENT_AMBULATORY_CARE_PROVIDER_SITE_OTHER): Payer: BLUE CROSS/BLUE SHIELD | Admitting: Physician Assistant

## 2017-10-17 ENCOUNTER — Encounter: Payer: Self-pay | Admitting: Physician Assistant

## 2017-10-17 VITALS — BP 100/68 | HR 63 | Temp 97.9°F | Ht 61.5 in | Wt 154.8 lb

## 2017-10-17 DIAGNOSIS — F324 Major depressive disorder, single episode, in partial remission: Secondary | ICD-10-CM | POA: Diagnosis not present

## 2017-10-17 MED ORDER — FLUOXETINE HCL 10 MG PO TABS: 10.0000 mg | ORAL_TABLET | Freq: Every day | ORAL | 0 refills | Status: DC

## 2017-10-17 MED ORDER — FLUOXETINE HCL 10 MG PO TABS: 10.0000 mg | ORAL_TABLET | Freq: Every day | ORAL | 1 refills | Status: DC

## 2017-10-17 NOTE — Progress Notes (Signed)
Tracey Moody is a 62 y.o. female is here to discuss: Depression   I acted as a Neurosurgeonscribe for [employer]Samantha Caiden Monsivais, PA-C Corky Mullonna Orphanos, LPN History of Present Illness:   Chief Complaint  Patient presents with  . Follow-up  . Depression    Depression         This is a chronic problem.  Episode onset: Pt here for follow up is doing good, Dx in July 2018.   The problem occurs rarely.  The problem has been rapidly improving since onset.  Associated symptoms include fatigue and decreased interest.  Associated symptoms include no decreased concentration, no helplessness, no hopelessness, does not have insomnia, not irritable, no restlessness, no appetite change, no body aches, no myalgias, no headaches, no indigestion, not sad and no suicidal ideas.     The symptoms are aggravated by family issues.  Past treatments include MAOIs - Monoamine oxidase inhibitors.  Compliance with treatment is good.  Previous treatment provided significant relief.  She is currently doing very well. Planning to take off a few weeks to see her son and granddaughter. She is interested in weaning Prozac to see if it helps with her energy level, often has issues with "lacking motivation" but cannot tell if she's just exhausted from working so much.   Dep[facility] 2/9 10/17/2017 10/17/2017 05/04/2017  Decreased Interest - 0 0  Down, Depressed, Hopeless 0 0 0  PHQ - 2 Score 0 0 0  Altered sleeping 0 - 0  Tired, decreased energy 3 - 1  Change in appetite 1 - 0  Feeling bad or failure about yourself  0 - 0  Trouble concentrating 0 - 0  Moving slowly or fidgety/restless 0 - 0  Suicidal thoughts 0 - 0  PHQ-9 Score 4 - 1  Difficult doing work/chores Not difficult at all - Not difficult at all     Health Maintenance Due  Topic Date Due  . COLONOSCOPY  06/17/2005    History reviewed. No pertinent past medical history.   Social History   Socioeconomic History  . Marital status: Married    Spouse name:  Not on file  . Number of children: Not on file  . Years of education: Not on file  . Highest education level: Not on file  Social Needs  . Financial resource strain: Not on file  . Food insecurity - worry: Not on file  . Food insecurity - inability: Not on file  . Transportation needs - medical: Not on file  . Transportation needs - non-medical: Not on file  Occupational History  . Not on file  Tobacco Use  . Smoking status: Never Smoker  . Smokeless tobacco: Never Used  Substance and Sexual Activity  . Alcohol use: Yes    Comment: occassionally beer  . Drug use: No  . Sexual activity: Yes    Birth control/protection: Other-see comments    Comment: Husband Vasectomy  Other Topics Concern  . 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 APPENDECTOMY  1967  . CESAREAN SECTION  1979, 1980, [id number], 1984, 1987  . TONSILLECTOMY  1960    Family History  Problem Relation Age of Onset  . Lung cancer Mother   . Skin cancer Father   . Hypertension  Father   . Hypercholesterolemia Sister   . Hypercholesterolemia Brother   . CVA Maternal Grandmother   . Heart attack Sister   . Hypercholesterolemia Brother   . Hypercholesterolemia Sister   . Breast cancer Neg Hx     PMHx, SurgHx, SocialHx, FamHx, Medications, and Allergies were reviewed in the Visit Navigator and updated as appropriate.   Patient Active Problem List   Diagnosis Date Noted  . Depression, major, single episode, mild (HCC) 02/15/2017  . Gastritis 02/15/2017  . Vitamin D deficiency 02/15/2017  . Chronic pain of both knees 02/15/2017  . Solar lentigo 02/15/2017    Social History   Tobacco Use  . Smoking status: Never Smoker  . Smokeless tobacco: Never Used  Substance Use Topics  . Alcohol use: Yes    Comment: occassionally beer  . Drug  use: No    Current Medications and Allergies:    Current Outpatient Medications:  .  aspirin EC 81 MG tablet, Take 81 mg by mouth daily., Disp: , Rfl:  .  b complex vitamins tablet, Take 1 tablet by mouth daily., Disp: , Rfl:  .  BIOTIN PO, Take 1 tablet by mouth daily., Disp: , Rfl:  .  Calcium Citrate (CITRACAL PO), Take 1,000 mg by mouth daily., Disp: , Rfl:  .  Cholecalciferol (VITAMIN D3) 2000 units TABS, Take 1 tablet by mouth daily., Disp: , Rfl:  .  Echinacea 500 MG CAPS, Take 2 capsules by mouth daily., Disp: , Rfl:  .  famotidine (PEPCID) 20 MG tablet, Take 1 tablet (20 mg total) by mouth daily., Disp: 90 tablet, Rfl: 1 .  FLUoxetine (PROZAC) 10 MG tablet, Take 1 tablet (10 mg total) by mouth daily., Disp: 90 tablet, Rfl: 1 .  naproxen sodium (ANAPROX) 220 MG tablet, Take 220 mg by mouth as needed., Disp: , Rfl:  .  Omega-3 Fatty Acids (FISH OIL) 1000 MG CPDR, Take 1 capsule by mouth daily., Disp: , Rfl:  .  Turmeric 500 MG CAPS, Take 1 capsule by mouth daily., Disp: , Rfl:   No Known Allergies  Review of Systems   Review of Systems  Constitutional: Positive for fatigue. Negative for appetite change.  Musculoskeletal: Negative for myalgias.  Neurological: Negative for headaches.  Psychiatric/Behavioral: Positive for depression. Negative for decreased concentration and suicidal ideas. The patient does not have insomnia.     Vitals:   Vitals:   10/17/17 0836  BP: 100/68  Pulse: 63  Temp: 97.9 F (36.6 C)  TempSrc: Oral  SpO2: 99%  Weight: 154 lb 12.8 oz (70.2 kg)  Height: 5' 1.5" (1.562 m)     Body mass index is 28.78 kg/m.   Physical Exam:    Physical Exam  Constitutional: She appears well-developed. She is not irritable and cooperative.  Non-toxic appearance. She does not have a sickly appearance. She does not appear ill. No distress.  Cardiovascular: Normal rate, regular rhythm, S1 normal, S2 normal, normal heart sounds and normal pulses.  No LE edema    Pulmonary/Chest: Effort normal and breath sounds normal.  Neurological: She is alert. GCS eye subscore is 4. GCS verbal subscore is 5. GCS motor subscore is 6.  Skin: Skin is warm, dry and intact.  Psychiatric: She has a normal mood and affect. Her speech is normal and behavior is normal.  Nursing note and vitals reviewed.    Assessment and Plan:    Sumire was seen today for follow-up and depression.  Diagnoses and all orders for this  visit:  Major depressive disorder with single episode, in partial remission (HCC) She is improved and interested in decreasing dosage. I advised her as follows "Start taking 10mg  Prozac daily.  If you feel further need to decrease, cut tablets in half and take 5 mg for 1 week. Then take 5 mg every other day for 1 week, and then you may stop medication. Let us know if you have questions." I advised for her to follow-up with [place] in 3-6 months, sooner if needed. We briefly discussed checking labs to assess for additional sources of fatigue, but she declined at this time.  Other orders -     FLUoxetine (PROZAC) 10 MG tablet; Take 1 tablet (10 mg total) by mouth daily.   . Reviewed expectations re: course of current medical issues. . Discussed self-management of symptoms. . Outlined signs and symptoms indicating need for more acute intervention. . Patient verbalized understanding and all questions were answered. . See orders for this visit as documented in the electronic medical record. . Patient received an After Visit Summary.  CMA or LPN served as scribe during this visit. History, Physical, and Plan performed by medical provider. Documentation and orders reviewed and attested to.  Jarold Motto, PA-C [place] 10/17/2017  Follow-up: Return in about 1 month (around 11/17/2017) for medication follow-up.

## 2017-10-17 NOTE — Patient Instructions (Addendum)
Please bring a copy of your colonoscopy to next visit.  Start taking 10mg  Prozac daily.  If you feel further need to decrease, cut tablets in half and take 5 mg for 1 week. Then take 5 mg every other day for 1 week, and then you may stop medication. Let us know if you have questions.  Follow-up in 3-6 months, sooner if needed!

## 2017-10-25 ENCOUNTER — Encounter: Payer: Self-pay | Admitting: Physician Assistant

## 2017-11-09 ENCOUNTER — Other Ambulatory Visit: Payer: Self-pay | Admitting: Physician Assistant

## 2017-11-13 ENCOUNTER — Other Ambulatory Visit: Payer: Self-pay | Admitting: Physician Assistant

## 2018-01-17 ENCOUNTER — Ambulatory Visit: Payer: BLUE CROSS/BLUE SHIELD | Admitting: Physician Assistant

## 2018-04-11 ENCOUNTER — Encounter: Payer: Self-pay | Admitting: Physician Assistant

## 2018-04-30 LAB — HM PAP SMEAR: HM Pap smear: NEGATIVE

## 2018-05-20 LAB — HM DEXA SCAN

## 2018-05-22 ENCOUNTER — Encounter: Payer: Self-pay | Admitting: Physician Assistant

## 2018-05-22 DIAGNOSIS — M810 Age-related osteoporosis without current pathological fracture: Secondary | ICD-10-CM | POA: Insufficient documentation

## 2018-06-03 ENCOUNTER — Encounter: Payer: Self-pay | Admitting: Physician Assistant

## 2018-06-03 ENCOUNTER — Ambulatory Visit (INDEPENDENT_AMBULATORY_CARE_PROVIDER_SITE_OTHER): Payer: 59 | Admitting: Physician Assistant

## 2018-06-03 VITALS — BP 130/80 | HR 81 | Temp 98.4°F | Ht 61.5 in | Wt 153.2 lb

## 2018-06-03 DIAGNOSIS — M8080XA Other osteoporosis with current pathological fracture, unspecified site, initial encounter for fracture: Secondary | ICD-10-CM | POA: Diagnosis not present

## 2018-06-03 DIAGNOSIS — M816 Localized osteoporosis [Lequesne]: Secondary | ICD-10-CM

## 2018-06-03 LAB — COMPREHENSIVE METABOLIC PANEL
ALBUMIN: 4.7 g/dL (ref 3.5–5.2)
ALK PHOS: 66 U/L (ref 39–117)
ALT: 13 U/L (ref 0–35)
AST: 18 U/L (ref 0–37)
BUN: 11 mg/dL (ref 6–23)
CHLORIDE: 104 meq/L (ref 96–112)
CO2: 27 mEq/L (ref 19–32)
Calcium: 10 mg/dL (ref 8.4–10.5)
Creatinine, Ser: 0.66 mg/dL (ref 0.40–1.20)
GFR: 96.15 mL/min (ref >60.00)
GLUCOSE: 94 mg/dL (ref 70–99)
POTASSIUM: 5.1 meq/L (ref 3.5–5.1)
SODIUM: 139 meq/L (ref 135–145)
Total Bilirubin: 0.5 mg/dL (ref 0.2–1.2)
Total Protein: 7.5 g/dL (ref 6.0–8.3)

## 2018-06-03 LAB — VITAMIN D 25 HYDROXY (VIT D DEFICIENCY, FRACTURES): VITD: 42.61 ng/mL (ref 30.00–100.00)

## 2018-06-03 MED ORDER — ALENDRONATE SODIUM 70 MG PO TABS: 70.0000 mg | ORAL_TABLET | ORAL | 11 refills | Status: DC

## 2018-06-03 NOTE — Patient Instructions (Signed)
At a minimum, recommend 800 IU of vitamin D and 1200mg  of Calcium per day. You can get this with a calcium-vitamin D supplement.  Once you have the above in place, I would start taking fosamax 70mg  once a week.  Administer first thing in the morning and >30 minutes before the first food, beverage (except plain water), or other medication of the day. Do not take with mineral water or with other beverages. Stay upright (not to lie down) for at least 30 minutes after taking medicine and until after first food of the day (to reduce irritation). Must be taken with 6 to 8 oz of plain water. The tablet should be swallowed whole; do not chew or suck.

## 2018-06-03 NOTE — Progress Notes (Signed)
Tracey Moody is a 62 y.o. female is here to discuss: Osteoporosis  I acted as a Neurosurgeon for [employer], PA-C Corky Mull, LPN  History of Present Illness:   Chief Complaint  Patient presents with  . Osteoporosis    HPI  She recently had a DEXA scan and was found to have osteoporosis with score of -2.7 in her L1 and L2 spine. She states that her mother took fosamax in the past. Her sister also has osteoporosis. This is the patient's first diagnosis of osteoporosis.   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 Denies prior personal fracture or hx of RA.  Health Maintenance Due  Topic Date Due  . COLONOSCOPY  06/17/2005    History reviewed. No pertinent past medical history.   Social History   Socioeconomic History  . Marital status: Married    Spouse name: Not on file  . Number of children: Not on file  . Years of education: Not on file  . Highest education level: Not on file  Occupational History  . Not on file  Social Needs  . Financial resource strain: Not on file  . Food insecurity:    Worry: Not on file    Inability: Not on file  . Transportation needs:    Medical: Not on file    Non-medical: Not on file  Tobacco Use  . Smoking status: Never Smoker  . Smokeless tobacco: Never Used  Substance and Sexual Activity  . Alcohol use: Yes    Comment: occassionally beer  . Drug use: No  . Sexual activity: Yes    Birth control/protection: Other-see comments    Comment: Husband Vasectomy  Lifestyle  . Physical activity:    Days per week: Not on file    Minutes per session: Not on file  . Stress: Not on file  Relationships  . Social connections:    Talks on phone: Not on file    Gets together: Not on file    Attends religious service: Not on file    Active member of club or organization: Not on file    Attends meetings of clubs or organizations: Not on file    Relationship status: Not on file  .  Intimate partner violence:    Fear of current or ex partner: Not on file    Emotionally abused: Not on file    Physically abused: Not on file    Forced sexual activity: Not on file  Other Topics Concern  . 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 APPENDECTOMY  1967  . CESAREAN SECTION  1979, 1980, 79, 1984, 1987  . TONSILLECTOMY  1960    Family History  Problem Relation Age of Onset  . Lung cancer Mother   . Osteoporosis Mother   . Skin cancer Father   . Hypertension Father   . Hypercholesterolemia Sister   . Hypercholesterolemia Brother   . CVA Maternal Grandmother   . Heart attack Sister   . Hypercholesterolemia Brother   . Hypercholesterolemia Sister   . Breast cancer Neg Hx     PMHx, SurgHx, SocialHx, FamHx, Medications, and Allergies were reviewed in the Visit Navigator and updated as appropriate.   Patient Active  Problem List   Diagnosis Date Noted  . Osteoporosis 05/22/2018  . Depression, major, single episode, mild (HCC) 02/15/2017  . Gastritis 02/15/2017  . Vitamin D deficiency 02/15/2017  . Chronic pain of both knees 02/15/2017  . Solar lentigo 02/15/2017    Social History   Tobacco Use  . Smoking status: Never Smoker  . Smokeless tobacco: Never Used  Substance Use Topics  . Alcohol use: Yes    Comment: occassionally beer  . Drug use: No    Current Medications and Allergies:    Current Outpatient Medications:  .  aspirin EC 81 MG tablet, Take 81 mg by mouth daily., Disp: , Rfl:  .  b complex vitamins tablet, Take 1 tablet by mouth daily., Disp: , Rfl:  .  BIOTIN PO, Take 1 tablet by mouth daily., Disp: , Rfl:  .  Calcium Citrate (CITRACAL PO), Take 1,000 mg by mouth daily., Disp: , Rfl:  .  Cholecalciferol (VITAMIN D3) 2000 units TABS, Take 1 tablet by mouth daily.,  Disp: , Rfl:  .  Echinacea 500 MG CAPS, Take 2 capsules by mouth daily., Disp: , Rfl:  .  famotidine (PEPCID) 20 MG tablet, TAKE 1 TABLET DAILY, Disp: 90 tablet, Rfl: 1 .  naproxen sodium (ANAPROX) 220 MG tablet, Take 220 mg by mouth as needed., Disp: , Rfl:  .  Omega-3 Fatty Acids (FISH OIL) 1000 MG CPDR, Take 1 capsule by mouth daily., Disp: , Rfl:  .  Turmeric 500 MG CAPS, Take 1 capsule by mouth daily., Disp: , Rfl:  .  alendronate (FOSAMAX) 70 MG tablet, Take 1 tablet (70 mg total) by mouth every 7 (seven) days. Take with a full glass of water on an empty stomach., Disp: 4 tablet, Rfl: 11  No Known Allergies  Review of Systems   ROS Negative unless otherwise specified per HPI.  Vitals:   Vitals:   06/03/18 1102  BP: 130/80  Pulse: 81  Temp: 98.4 F (36.9 C)  TempSrc: Oral  SpO2: 99%  Weight: 153 lb 4 oz (69.5 kg)  Height: 5' 1.5" (1.562 m)     Body mass index is 28.49 kg/m.   Physical Exam:    Physical Exam  Constitutional: She appears well-developed. She is cooperative.  Non-toxic appearance. She does not have a sickly appearance. She does not appear ill. No distress.  Cardiovascular: Normal rate, regular rhythm, S1 normal, S2 normal, normal heart sounds and normal pulses.  No LE edema  Pulmonary/Chest: Effort normal and breath sounds normal.  Neurological: She is alert. GCS eye subscore is 4. GCS verbal subscore is 5. GCS motor subscore is 6.  Skin: Skin is warm, dry and intact.  Psychiatric: She has a normal mood and affect. Her speech is normal and behavior is normal.  Nursing note and vitals reviewed.    Assessment and Plan:    Tracey Moody was seen today for osteoporosis.  Diagnoses and all orders for this visit:  Localized osteoporosis without current pathological fracture Today we discussed her options. She would like to trial weekly Fosamax. Instructions about taking medication reviewed with patient and on AVS. She states that her ob-gyn will manage  ordering her repeat DEXA. -     Comprehensive metabolic panel -     VITAMIN D 25 Hydroxy (Vit-D Deficiency, Fractures)  Other orders -     alendronate (FOSAMAX) 70 MG tablet; Take 1 tablet (70 mg total) by mouth every 7 (seven) days. Take with a full glass of water on  an empty stomach.  . Reviewed expectations re: course of current medical issues. . Discussed self-management of symptoms. . Outlined signs and symptoms indicating need for more acute intervention. . Patient verbalized understanding and all questions were answered. . See orders for this visit as documented in the electronic medical record. . Patient received an After Visit Summary.  Jarold Motto, PA-C [place] 06/03/2018  Follow-up: No follow-ups on file.

## 2018-06-07 ENCOUNTER — Encounter: Payer: Self-pay | Admitting: Physician Assistant

## 2018-06-14 ENCOUNTER — Encounter: Payer: Self-pay | Admitting: Physician Assistant

## 2018-06-17 ENCOUNTER — Other Ambulatory Visit: Payer: Self-pay | Admitting: Physician Assistant

## 2018-06-17 MED ORDER — ALENDRONATE SODIUM 70 MG PO TABS: 70.0000 mg | ORAL_TABLET | ORAL | 11 refills | Status: DC

## 2018-06-17 MED ORDER — FAMOTIDINE 20 MG PO TABS: 20.0000 mg | ORAL_TABLET | Freq: Every day | ORAL | 1 refills | Status: DC

## 2018-06-17 NOTE — Telephone Encounter (Signed)
Okay to refill medications? 

## 2018-06-24 ENCOUNTER — Ambulatory Visit: Payer: Self-pay

## 2018-06-24 NOTE — Telephone Encounter (Signed)
This morning, the pt was standing on a plastic chair and her leg went through the seat of the chair. She stated she fell on her buttocks and now has lower back pain. [place] is in [place] at the time of the call. Pt stated that the pain is constant and she has an ice pack to the area. Pt denies shooting pains to her legs or elsewhere. Pt stated she is helping her son pack, but has not lifted any heavy objects. Pt has not taken anything for the pain, but plans on taking Aleve. Pt denies fever, abdominal pian, burning with urination or blood in urine.  Pt plans to drive back home from [place] tomorrow. Care advice given to pt and pt verbalized understanding. Appt made for pt Wednesday at 10:20 with Jarold MottoSamantha Worley PA.  Reason for Disposition . [1] Age > 50 AND [2] no history of prior similar back pain  Answer Assessment - Initial Assessment Questions 1. ONSET: "When did the pain begin?"      Larey SeatFell this at 1035- pt was standing on a plastic chair and her leg went through the chair and she fell back on her buttocks and now is having lower back pain 2. LOCATION: "Where does it hurt?" (upper, mid or lower back)     Across lower back 3. SEVERITY: "How bad is the pain?"  (e.g., Scale 1-10; mild, moderate, or severe)   - MILD (1-3): doesn't interfere with normal activities    - MODERATE (4-7): interferes with normal activities or awakens from sleep    - SEVERE (8-10): excruciating pain, unable to do any normal activities      moderate 4. PATTERN: "Is the pain constant?" (e.g., yes, no; constant, intermittent)      constant 5. RADIATION: "Does the pain shoot into your legs or elsewhere?"     no 6. CAUSE:  "What do you think is causing the back pain?"      Fall  7. BACK OVERUSE:  "Any recent lifting of heavy objects, strenuous work or exercise?"     Packing for son but is not lifting any heavy packages 8. MEDICATIONS: "What have you taken so far for the pain?" (e.g., nothing, acetaminophen, NSAIDS)  nothing 9. NEUROLOGIC SYMPTOMS: "Do you have any weakness, numbness, or problems with bowel/bladder control?"     no 10. OTHER SYMPTOMS: "Do you have any other symptoms?" (e.g., fever, abdominal pain, burning with urination, blood in urine)       no 11. PREGNANCY: "Is there any chance you are pregnant?" (e.g., yes, no; LMP)       n/a  Protocols used: BACK PAIN-A-AH

## 2018-06-25 ENCOUNTER — Other Ambulatory Visit: Payer: Self-pay | Admitting: *Deleted

## 2018-06-25 MED ORDER — ALENDRONATE SODIUM 70 MG PO TABS: 70.0000 mg | ORAL_TABLET | ORAL | 3 refills | Status: DC

## 2018-06-26 ENCOUNTER — Ambulatory Visit (INDEPENDENT_AMBULATORY_CARE_PROVIDER_SITE_OTHER): Payer: 59

## 2018-06-26 ENCOUNTER — Encounter: Payer: Self-pay | Admitting: Physician Assistant

## 2018-06-26 ENCOUNTER — Ambulatory Visit (INDEPENDENT_AMBULATORY_CARE_PROVIDER_SITE_OTHER): Payer: 59 | Admitting: Physician Assistant

## 2018-06-26 VITALS — BP 112/74 | HR 66 | Temp 98.7°F | Ht 61.5 in | Wt 155.0 lb

## 2018-06-26 DIAGNOSIS — S3992XA Unspecified injury of lower back, initial encounter: Secondary | ICD-10-CM

## 2018-06-26 DIAGNOSIS — W19XXXA Unspecified fall, initial encounter: Secondary | ICD-10-CM

## 2018-06-26 DIAGNOSIS — Z23 Encounter for immunization: Secondary | ICD-10-CM | POA: Diagnosis not present

## 2018-06-26 MED ORDER — BACLOFEN 5 MG PO TABS: 5.0000 mg | ORAL_TABLET | Freq: Every evening | ORAL | 0 refills | Status: DC | PRN

## 2018-06-26 NOTE — Progress Notes (Signed)
Nance PewRebecca L Ante is a 62 y.o. female here for a new problem.  History of Present Illness:   Chief Complaint  Patient presents with  . Back Pain    Fell on 06/24/18 Lower back pain   . Tailbone Pain    HPI   Patient fell on 06/14/18 while standing on a plastic chair and her leg went through the seat of the chair. She has been using ice and Advil as needed for her pain. She states that her pain is gradually improving with time. She did sustain a prior tailbone injury a few years ago while rollerblading however she did not seek medical attention at that time. She is concerned today because of her recent dx of osteoporosis. She has not started her Fosamax yet.  She denies: bowel/bladder incontinence, numbness/tingling, swelling, fever, uncontrolled pain  History reviewed. No pertinent past medical history.   Social History   Socioeconomic History  . Marital status: Married    Spouse name: Not on file  . Number of children: Not on file  . Years of education: Not on file  . Highest education level: Not on file  Occupational History  . Not on file  Social Needs  . Financial resource strain: Not on file  . Food insecurity:    Worry: Not on file    Inability: Not on file  . Transportation needs:    Medical: Not on file    Non-medical: Not on file  Tobacco Use  . Smoking status: Never Smoker  . Smokeless tobacco: Never Used  Substance and Sexual Activity  . Alcohol use: Yes    Comment: occassionally beer  . Drug use: No  . Sexual activity: Yes    Birth control/protection: Other-see comments    Comment: Husband Vasectomy  Lifestyle  . Physical activity:    Days per week: Not on file    Minutes per session: Not on file  . Stress: Not on file  Relationships  . Social connections:    Talks on phone: Not on file    Gets together: Not on file    Attends religious service: Not on file    Active member of club or organization: Not on file    Attends meetings of clubs or  organizations: Not on file    Relationship status: Not on file  . Intimate partner violence:    Fear of current or ex partner: Not on file    Emotionally abused: Not on file    Physically abused: Not on file    Forced sexual activity: Not on file  Other Topics Concern  . 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 APPENDECTOMY  1967  . CESAREAN SECTION  1979, 1980, [id number], 1984, 1987  . TONSILLECTOMY  1960    Family History  Problem Relation Age of Onset  . Lung cancer Mother   . Osteoporosis Mother   . Skin cancer Father   . Hypertension Father   . Hypercholesterolemia Sister   . Hypercholesterolemia Brother   . CVA Maternal Grandmother   . Heart attack Sister   . Hypercholesterolemia Brother   . Hypercholesterolemia Sister   . Breast cancer Neg Hx     No Known Allergies  Current Medications:   Current Outpatient Medications:  .  alendronate (FOSAMAX) 70 MG tablet, Take 1 tablet (70 mg total) by mouth every 7 (seven) days. Take with a full glass of water on an empty stomach., Disp: 12 tablet, Rfl: 3 .  aspirin EC 81 MG tablet, Take 81 mg by mouth daily., Disp: , Rfl:  .  b complex vitamins tablet, Take 1 tablet by mouth daily., Disp: , Rfl:  .  BIOTIN PO, Take 1 tablet by mouth daily., Disp: , Rfl:  .  Calcium Citrate (CITRACAL PO), Take 1,000 mg by mouth daily., Disp: , Rfl:  .  Cholecalciferol (VITAMIN D3) 2000 units TABS, Take 1 tablet by mouth daily., Disp: , Rfl:  .  Echinacea 500 MG CAPS, Take 2 capsules by mouth daily., Disp: , Rfl:  .  famotidine (PEPCID) 20 MG tablet, Take 1 tablet (20 mg total) by mouth daily., Disp: 90 tablet, Rfl: 1 .  naproxen sodium (ANAPROX) 220 MG tablet, Take 220 mg by mouth as needed., Disp: , Rfl:  .  Omega-3 Fatty Acids (FISH OIL) 1000 MG CPDR, Take 1 capsule by  mouth daily., Disp: , Rfl:  .  Turmeric 500 MG CAPS, Take 1 capsule by mouth daily., Disp: , Rfl:  .  Baclofen 5 MG TABS, Take 5 mg by mouth at bedtime as needed., Disp: 30 tablet, Rfl: 0   Review of Systems:   ROS  Negative unless otherwise specified per HPI.   Vitals:   Vitals:   06/26/18 1027  BP: 112/74  Pulse: 66  Temp: 98.7 F (37.1 C)  TempSrc: Oral  SpO2: 99%  Weight: 155 lb (70.3 kg)  Height: 5' 1.5" (1.562 m)     Body mass index is 28.81 kg/m.  Physical Exam:   Physical Exam  Constitutional: She is oriented to person, place, and time. She appears well-developed and well-nourished.  HENT:  Head: Normocephalic and atraumatic.  Eyes: Conjunctivae and EOM are normal.  Neck: Normal range of motion. Neck supple.  Pulmonary/Chest: Effort normal.  Musculoskeletal: Normal range of motion.  Decreased ROM 2/2 pain rotation and lateral side bends. No decreased ROM with flexion/extension.  Bony tenderness with palpation to coccyx.  Reproducible tenderness with deep palpation to bilateral paraspinal lumbar muscles and L SI joint. No evidence of erythema, rash or ecchymosis.   Neurological: She is alert and oriented to person, place, and time.  Skin: Skin is warm and dry.  Psychiatric: She has a normal mood and affect. Her behavior is normal. Judgment and thought content normal.   Sacrum/coccyx xray: awaiting official radiology read, no obvious fracture on my review  Assessment and Plan:   Keyonni was seen today for back pain and tailbone pain.  Diagnoses and all orders for this visit:  Fall, initial encounter No red flags on exam. Continue NSAIDs. Discussed taking pepcid to help with GI irritation while on this medication. Awaiting xray results. Recommended limited lifting for the next two weeks. I also discussed following up with Dr. Berline Chough here in our office if no improvement of symptoms or any changes in symptoms. -     DG Sacrum/Coccyx; Future  Need for  prophylactic vaccination and inoculation against varicella -     Varicella-zoster vaccine IM (Shingrix)  Other orders -     Baclofen 5 MG TABS; Take 5 mg by mouth at bedtime as needed.   . Reviewed expectations re: course of current medical issues. . Discussed self-management of symptoms. . Outlined signs and symptoms indicating need for more acute intervention. . Patient verbalized  understanding and all questions were answered. . See orders for this visit as documented in the electronic medical record. . Patient received an After-Visit Summary.  CMA or LPN served as scribe during this visit. History, Physical, and Plan performed by medical provider. The above documentation has been reviewed and is accurate and complete.  Jarold Motto, PA-C

## 2018-06-26 NOTE — Patient Instructions (Signed)
It was great to see you!  May use baclofen if needed for your low back pain.  Please call and make an appointment with Dr. Berline Choughigby here at our office if your pain is not improving or if it is worse.  Take care,  Jarold MottoSamantha Jaydenn Boccio PA-C

## 2018-08-27 ENCOUNTER — Ambulatory Visit (INDEPENDENT_AMBULATORY_CARE_PROVIDER_SITE_OTHER): Payer: 59

## 2018-08-27 DIAGNOSIS — Z23 Encounter for immunization: Secondary | ICD-10-CM

## 2018-08-27 NOTE — Progress Notes (Signed)
Per orders of Jarold Motto, PA-C, injection of Shingrix given left deltoid IM by Olevia Bowens, CMA  Patient tolerated injection well.

## 2018-10-22 ENCOUNTER — Encounter: Payer: Self-pay | Admitting: Physician Assistant

## 2018-10-23 ENCOUNTER — Other Ambulatory Visit: Payer: Self-pay

## 2018-10-23 ENCOUNTER — Encounter: Payer: Self-pay | Admitting: Physician Assistant

## 2018-10-23 ENCOUNTER — Ambulatory Visit (INDEPENDENT_AMBULATORY_CARE_PROVIDER_SITE_OTHER): Payer: 59 | Admitting: Physician Assistant

## 2018-10-23 VITALS — BP 120/62 | HR 61 | Temp 98.6°F | Ht 61.5 in | Wt 149.0 lb

## 2018-10-23 DIAGNOSIS — W19XXXA Unspecified fall, initial encounter: Secondary | ICD-10-CM

## 2018-10-23 DIAGNOSIS — M816 Localized osteoporosis [Lequesne]: Secondary | ICD-10-CM | POA: Diagnosis not present

## 2018-10-23 MED ORDER — BACLOFEN 5 MG PO TABS: 5.0000 mg | ORAL_TABLET | Freq: Every evening | ORAL | 1 refills | Status: DC | PRN

## 2018-10-23 NOTE — Progress Notes (Signed)
Virtual Visit via Video   I connected with Tracey Moody on 10/23/18 at  2:40 PM EDT by a video enabled telemedicine application and verified that I am speaking with the correct person using two identifiers. Location patient: Home Location provider: [employer], Office Persons participating in the virtual visit: AYRAM BELUE, [place], [place], Jarold Motto, [place]  I discussed the limitations of evaluation and management by telemedicine and the availability of in person appointments. The patient expressed understanding and agreed to proceed.  Subjective:   HPI:  Fall Pt fell on Saturday 3/21. Pt was taking a walk with her grandson and she tripped on the sidewalk, fell with hands out to catch her self, scraped her hands and knees. Pt has pain when she takes a deep breath thinks bruised rib, but is getting better. Pt c/o rib pain under left breast area, neck and right shoulder pain. Pt cleaned wounds throughly. Pt took Baclofen for the past two nights to help with pain and feels better. Pt taking Aleve also for pain with relief. Pt is not having any pain at present. Pt would like refill on Baclofen.  Denies: pain with movement of wrist, purulent drainage from wounds, fever, sudden onset SOB  She does have hx of osteoporosis and is on fosamax currently.  ROS: See pertinent positives and negatives per HPI.  Patient Active Problem List   Diagnosis Date Noted   Osteoporosis 05/22/2018   Depression, major, single episode, mild (HCC) 02/15/2017   Gastritis 02/15/2017   Vitamin D deficiency 02/15/2017   Chronic pain of both knees 02/15/2017   Solar lentigo 02/15/2017    Social History   Tobacco Use   Smoking status: Never Smoker   Smokeless tobacco: Never Used  Substance Use Topics   Alcohol use: Yes    Comment: occassionally beer    Current Outpatient Medications:    alendronate (FOSAMAX) 70 MG tablet, Take 1 tablet (70 mg total) by mouth every 7  (seven) days. Take with a full glass of water on an empty stomach., Disp: 12 tablet, Rfl: 3   aspirin EC 81 MG tablet, Take 81 mg by mouth daily., Disp: , Rfl:    b complex vitamins tablet, Take 1 tablet by mouth daily., Disp: , Rfl:    Baclofen 5 MG TABS, Take 5 mg by mouth at bedtime as needed., Disp: 30 tablet, Rfl: 1   BIOTIN PO, Take 1 tablet by mouth daily., Disp: , Rfl:    Calcium Citrate (CITRACAL PO), Take 1,200 mg by mouth daily. , Disp: , Rfl:    Cholecalciferol (VITAMIN D3) 2000 units TABS, Take 1 tablet by mouth daily., Disp: , Rfl:    Echinacea 500 MG CAPS, Take 2 capsules by mouth daily., Disp: , Rfl:    famotidine (PEPCID) 20 MG tablet, Take 1 tablet (20 mg total) by mouth daily., Disp: 90 tablet, Rfl: 1   naproxen sodium (ANAPROX) 220 MG tablet, Take 220 mg by mouth as needed., Disp: , Rfl:    Omega-3 Fatty Acids (FISH OIL) 1000 MG CPDR, Take 1 capsule by mouth daily., Disp: , Rfl:    Turmeric 500 MG CAPS, Take 1 capsule by mouth daily., Disp: , Rfl:   No Known Allergies  Objective:   VITALS: Per patient if applicable, see vitals. GENERAL: Alert, appears well and in no acute distress. HEENT: Atraumatic, conjunctiva clear, no obvious abnormalities on inspection of external nose and ears. NECK: Normal movements of the head and neck. CARDIOPULMONARY: No  increased WOB. Speaking in clear sentences. I:E ratio WNL.  MS: Moves all visible extremities without noticeable abnormality. (patient showed how to check for snuffbox tenderness -- denies pain) PSYCH: Pleasant and cooperative, well-groomed. Speech normal rate and rhythm. Affect is appropriate. Insight and judgement are appropriate. Attention is focused, linear, and appropriate.  NEURO: CN grossly intact. Oriented as arrived to appointment on time with no prompting. Moves both UE equally.  SKIN: No obvious lesions, wounds, erythema, or cyanosis noted on face or hands.  Assessment and Plan:   Tracey Moody was seen today  for fall and left rib pain, neck pain and right shoulder pain.  Diagnoses and all orders for this visit:  Fall, initial encounter Suspect L hand muscle strain and L rib contusion. 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 Refilled baclofen today. Discussed worsening signs and need for immobolization of L wrist if develops ANY pain whatsoever. Patient verbalized understanding.  Localized osteoporosis without current pathological fracture Low threshold to xray if needed if any lingering pain -- discussed with patient and she verbalized understanding and knows to reach out if needed.  Other orders -     Baclofen 5 MG TABS; Take 5 mg by mouth at bedtime as needed.   Reviewed expectations re: course of current medical issues.  Discussed self-management of symptoms.  Outlined signs and symptoms indicating need for more acute intervention.  Patient verbalized understanding and all questions were answered.  Health Maintenance issues including appropriate healthy diet, exercise, and smoking avoidance were discussed with patient.  See orders for this visit as documented in the electronic medical record.  I discussed the assessment and treatment plan with the patient. The patient was provided an opportunity to ask questions and all were answered. The patient agreed with the plan and demonstrated an understanding of the instructions.   The patient was advised to call back or seek an in-person evaluation if the symptoms worsen or if the condition fails to improve as anticipated.  CMA or LPN served as scribe during this visit. History, Physical, and Plan performed by medical provider. The above documentation has been reviewed and is accurate and complete.   I provided 15 minutes of non-face-to-face time during this encounter.   [place], [place] 10/23/2018

## 2018-10-23 NOTE — Telephone Encounter (Signed)
Please call pt and schedule WebEx.

## 2018-10-23 NOTE — Telephone Encounter (Signed)
Please see message, okay to refill Baclofen 5 mg?

## 2018-12-12 ENCOUNTER — Other Ambulatory Visit: Payer: Self-pay | Admitting: Physician Assistant

## 2018-12-12 NOTE — Telephone Encounter (Signed)
Left message on voicemail to call office. Needs virtual visit for medication refill.

## 2018-12-13 ENCOUNTER — Ambulatory Visit (INDEPENDENT_AMBULATORY_CARE_PROVIDER_SITE_OTHER): Payer: 59 | Admitting: Physician Assistant

## 2018-12-13 ENCOUNTER — Encounter: Payer: Self-pay | Admitting: Physician Assistant

## 2018-12-13 DIAGNOSIS — F320 Major depressive disorder, single episode, mild: Secondary | ICD-10-CM

## 2018-12-13 DIAGNOSIS — M816 Localized osteoporosis [Lequesne]: Secondary | ICD-10-CM

## 2018-12-13 DIAGNOSIS — K2970 Gastritis, unspecified, without bleeding: Secondary | ICD-10-CM | POA: Diagnosis not present

## 2018-12-13 MED ORDER — PANTOPRAZOLE SODIUM 40 MG PO TBEC: 40.0000 mg | DELAYED_RELEASE_TABLET | Freq: Every day | ORAL | 0 refills | Status: DC

## 2018-12-13 NOTE — Progress Notes (Signed)
Virtual Visit via Video   I connected with Tracey Moody on 12/13/18 at 11:00 AM EDT by a video enabled telemedicine application and verified that I am speaking with the correct person using two identifiers. Location patient: Home Location provider: [facility], Office Persons participating in the virtual visit: Tracey Moody, Jarold Motto PA-C, Corky Mull, LPN   I discussed the limitations of evaluation and management by telemedicine and the availability of in person appointments. The patient expressed understanding and agreed to proceed.  I acted as a Neurosurgeon for [employer], [employer], LPN  Subjective:   HPI: Depression Pt has been doing well currently on no medication. Denies SI/HI. Denies any concerns presently.  Depression screen [facility] 2/9 12/13/2018 10/17/2017 10/17/2017 05/04/2017 02/15/2017  Decreased Interest 0 - 0 0 2  Down, Depressed, Hopeless 0 0 0 0 1  PHQ - 2 Score 0 0 0 0 3  Altered sleeping 1 0 - 0 0  Tired, decreased energy 1 3 - 1 3  Change in appetite 0 1 - 0 0  Feeling bad or failure about yourself  0 0 - 0 0  Trouble concentrating 0 0 - 0 0  Moving slowly or fidgety/restless 0 0 - 0 0  Suicidal thoughts 0 0 - 0 0  PHQ-9 Score 2 4 - 1 6  Difficult doing work/chores - Not difficult at all - Not difficult at all -     Gastritis  Pt has been having increase in heartburn since starting on Fosamax in Nov. She has been using Pepcid 20 mg daily and taking Maalox and Tums in between to help with this. She feels as though her symptoms are worsened with certain foods. She does feel like she takes her Fosamax appropriately.  ROS: See pertinent positives and negatives per HPI.  Patient Active Problem List   Diagnosis Date Noted  . Osteoporosis 05/22/2018  . Depression, major, single episode, mild (HCC) 02/15/2017  . Gastritis 02/15/2017  . Vitamin D deficiency 02/15/2017  . Chronic pain of both knees 02/15/2017  . Solar lentigo  02/15/2017    Social History   Tobacco Use  . Smoking status: Never Smoker  . Smokeless tobacco: Never Used  Substance Use Topics  . Alcohol use: Yes    Comment: occassionally beer    Current Outpatient Medications:  .  alendronate (FOSAMAX) 70 MG tablet, Take 1 tablet (70 mg total) by mouth every 7 (seven) days. Take with a full glass of water on an empty stomach., Disp: 12 tablet, Rfl: 3 .  aspirin EC 81 MG tablet, Take 81 mg by mouth daily., Disp: , Rfl:  .  b complex vitamins tablet, Take 1 tablet by mouth daily., Disp: , Rfl:  .  Baclofen 5 MG TABS, Take 5 mg by mouth at bedtime as needed., Disp: 30 tablet, Rfl: 1 .  BIOTIN PO, Take 1 tablet by mouth daily., Disp: , Rfl:  .  Calcium Citrate (CITRACAL PO), Take 1,200 mg by mouth daily. , Disp: , Rfl:  .  Cholecalciferol (VITAMIN D3) 2000 units TABS, Take 1 tablet by mouth daily., Disp: , Rfl:  .  Echinacea 500 MG CAPS, Take 2 capsules by mouth daily., Disp: , Rfl:  .  naproxen sodium (ANAPROX) 220 MG tablet, Take 220 mg by mouth as needed., Disp: , Rfl:  .  Omega-3 Fatty Acids (FISH OIL) 1000 MG CPDR, Take 1 capsule by mouth daily., Disp: , Rfl:  .  Turmeric 500  MG CAPS, Take 1 capsule by mouth daily., Disp: , Rfl:  .  famotidine (PEPCID) 20 MG tablet, TAKE 1 TABLET DAILY, Disp: 90 tablet, Rfl: 1  No Known Allergies  Objective:   VITALS: Per patient if applicable, see vitals. GENERAL: Alert, appears well and in no acute distress. HEENT: Atraumatic, conjunctiva clear, no obvious abnormalities on inspection of external nose and ears. NECK: Normal movements of the head and neck. CARDIOPULMONARY: No increased WOB. Speaking in clear sentences. I:E ratio WNL.  MS: Moves all visible extremities without noticeable abnormality. PSYCH: Pleasant and cooperative, well-groomed. Speech normal rate and rhythm. Affect is appropriate. Insight and judgement are appropriate. Attention is focused, linear, and appropriate.  NEURO: CN grossly  intact. Oriented as arrived to appointment on time with no prompting. Moves both UE equally.  SKIN: No obvious lesions, wounds, erythema, or cyanosis noted on face or hands.  Assessment and Plan:   [name] was seen today for depression and gastritis.  Diagnoses and all orders for this visit:  Depression, major, single episode, mild (HCC) Currently well controlled without medication. I discussed with patient that if they develop any SI, to tell someone immediately and seek medical attention.  Gastritis, presence of bleeding unspecified, unspecified chronicity, unspecified gastritis type; Localized osteoporosis without current pathological fracture Uncontrolled. Will discontinue Pepcid and start PPI. Did offer discontinuation of Fosamax but she is reluctant at this time. Follow-up in 3 months, sooner if issues.  . Reviewed expectations re: course of current medical issues. . Discussed self-management of symptoms. . Outlined signs and symptoms indicating need for more acute intervention. . Patient verbalized understanding and all questions were answered. Marland Kitchen. Health Maintenance issues including appropriate healthy diet, exercise, and smoking avoidance were discussed with patient. . See orders for this visit as documented in the electronic medical record.  I discussed the assessment and treatment plan with the patient. The patient was provided an opportunity to ask questions and all were answered. The patient agreed with the plan and demonstrated an understanding of the instructions.   The patient was advised to call back or seek an in-person evaluation if the symptoms worsen or if the condition fails to improve as anticipated.   CMA or LPN served as scribe during this visit. History, Physical, and Plan performed by medical provider. The above documentation has been reviewed and is accurate and complete.   [name], [place] 12/13/2018

## 2019-03-21 ENCOUNTER — Encounter: Payer: Self-pay | Admitting: Physician Assistant

## 2019-04-01 ENCOUNTER — Encounter: Payer: Self-pay | Admitting: Physician Assistant

## 2019-04-01 MED ORDER — ALENDRONATE SODIUM 70 MG PO TABS: 70.0000 mg | ORAL_TABLET | ORAL | 0 refills | Status: DC

## 2019-05-09 ENCOUNTER — Other Ambulatory Visit: Payer: Self-pay

## 2019-05-09 ENCOUNTER — Encounter: Payer: Self-pay | Admitting: Physician Assistant

## 2019-05-09 ENCOUNTER — Ambulatory Visit: Payer: Self-pay | Admitting: Physician Assistant

## 2019-05-09 VITALS — BP 130/86 | HR 69 | Temp 98.3°F | Ht 61.5 in | Wt 154.2 lb

## 2019-05-09 DIAGNOSIS — Z0000 Encounter for general adult medical examination without abnormal findings: Secondary | ICD-10-CM

## 2019-05-09 DIAGNOSIS — E663 Overweight: Secondary | ICD-10-CM

## 2019-05-09 DIAGNOSIS — E559 Vitamin D deficiency, unspecified: Secondary | ICD-10-CM

## 2019-05-09 DIAGNOSIS — Z13220 Encounter for screening for lipoid disorders: Secondary | ICD-10-CM

## 2019-05-09 DIAGNOSIS — Z136 Encounter for screening for cardiovascular disorders: Secondary | ICD-10-CM

## 2019-05-09 DIAGNOSIS — K219 Gastro-esophageal reflux disease without esophagitis: Secondary | ICD-10-CM

## 2019-05-09 DIAGNOSIS — M816 Localized osteoporosis [Lequesne]: Secondary | ICD-10-CM

## 2019-05-09 LAB — COMPREHENSIVE METABOLIC PANEL
ALT: 12 U/L (ref 0–35)
AST: 14 U/L (ref 0–37)
Albumin: 4.8 g/dL (ref 3.5–5.2)
Alkaline Phosphatase: 55 U/L (ref 39–117)
BUN: 15 mg/dL (ref 6–23)
CO2: 28 mEq/L (ref 19–32)
Calcium: 10.1 mg/dL (ref 8.4–10.5)
Chloride: 102 mEq/L (ref 96–112)
Creatinine, Ser: 0.68 mg/dL (ref 0.40–1.20)
GFR: 87.14 mL/min (ref >60.00)
Glucose, Bld: 95 mg/dL (ref 70–99)
Potassium: 4.5 mEq/L (ref 3.5–5.1)
Sodium: 140 mEq/L (ref 135–145)
Total Bilirubin: 0.5 mg/dL (ref 0.2–1.2)
Total Protein: 7.6 g/dL (ref 6.0–8.3)

## 2019-05-09 LAB — LIPID PANEL
Cholesterol: 307 mg/dL — ABNORMAL HIGH (ref 0–200)
HDL: 77.8 mg/dL (ref >39.00)
LDL Cholesterol: 211 mg/dL — ABNORMAL HIGH (ref 0–99)
NonHDL: 229.62
Total CHOL/HDL Ratio: 4
Triglycerides: 92 mg/dL (ref 0.0–149.0)
VLDL: 18.4 mg/dL (ref 0.0–40.0)

## 2019-05-09 LAB — VITAMIN D 25 HYDROXY (VIT D DEFICIENCY, FRACTURES): VITD: 37.28 ng/mL (ref 30.00–100.00)

## 2019-05-09 NOTE — Progress Notes (Signed)
I acted as a Education administrator for [employer], PA-C Anselmo Pickler, LPN   Subjective:    Tracey Moody is a 63 y.o. female and is here for a comprehensive physical exam.   HPI  There are no preventive care reminders to display for this patient.  Acute Concerns: None  Chronic Issues: Osteoporosis --patient reports that she is tolerating her Fosamax.  Last DEXA scan was May 20, 2018.  She continues to take vitamin D and calcium regularly.  She denies any issues with new or unusual back pain, or difficulties tolerating medication. GERD --longstanding issue for patient.  At our last visit we switched her to Protonix, she states that it did not make a significant change for her.  Her site her symptoms are mostly triggered by certain foods and therefore managed by avoiding these foods.  Health Maintenance: Immunizations -- had flu shot last week Colonoscopy --overdue, patient plans to schedule a more convenient time Mammogram --scheduled for next week PAP --2018, normal Bone Density --2019 Diet --variable, states that she does any healthy Caffeine intake --coffee daily Sleep habits --overall no problems Exercise --goes walking Weight -- Weight: 154 lb 4 oz (70 kg)  Mood --no issues with anxiety or depression Weight history: Wt Readings from Last 10 Encounters:  05/09/19 154 lb 4 oz (70 kg)  10/23/18 149 lb (67.6 kg)  06/26/18 155 lb (70.3 kg)  06/03/18 153 lb 4 oz (69.5 kg)  10/17/17 154 lb 12.8 oz (70.2 kg)  05/04/17 151 lb 6.4 oz (68.7 kg)  03/01/17 141 lb 4 oz (64.1 kg)  02/15/17 139 lb 8 oz (63.3 kg)   No LMP recorded. Patient is postmenopausal. Period characteristics: none Alcohol use: rare gin and tonic Tobacco use: none  Depression screen PHQ 2/9 05/09/2019  Decreased Interest 0  Down, Depressed, Hopeless 0  PHQ - 2 Score 0  Altered sleeping 1  Tired, decreased energy 1  Change in appetite 0  Feeling bad or failure about yourself  0  Trouble  concentrating 0  Moving slowly or fidgety/restless 0  Suicidal thoughts 0  PHQ-9 Score 2  Difficult doing work/chores Not difficult at all     Other providers/specialists: Patient Care Team: Inda Coke, [place] as PCP - General (Physician Assistant)    PMHx, SurgHx, SocialHx, Medications, and Allergies were reviewed in the Visit Navigator and updated as appropriate.   History reviewed. No pertinent past medical history.   Past Surgical History:  Procedure Laterality Date  . APPENDECTOMY  1967  . [place]  . TONSILLECTOMY  1960     Family History  Problem Relation Age of Onset  . Lung cancer Mother   . Osteoporosis Mother   . Skin cancer Father   . Hypertension Father   . Hypercholesterolemia Sister   . Hypercholesterolemia Brother   . CVA Maternal Grandmother   . Heart attack Sister   . Hypercholesterolemia Brother   . Hypercholesterolemia Sister   . Breast cancer Neg Hx     Social History   Tobacco Use  . Smoking status: Never Smoker  . Smokeless tobacco: Never Used  Substance Use Topics  . Alcohol use: Yes    Comment: occassionally beer  . Drug use: No    Review of Systems:   Review of Systems  Constitutional: Negative.  Negative for chills, fever, malaise/fatigue and weight loss.  HENT: Negative.  Negative for hearing loss, sinus pain and sore throat.   Eyes: Negative.  Negative for blurred vision.  Respiratory: Negative.  Negative for cough and shortness of breath.   Cardiovascular: Negative.  Negative for chest pain, palpitations and leg swelling.  Gastrointestinal: Positive for heartburn. Negative for abdominal pain, constipation, diarrhea, nausea and vomiting.  Genitourinary: Negative.  Negative for dysuria, frequency and urgency.  Musculoskeletal: Negative.  Negative for back pain, myalgias and neck pain.  Skin: Negative.  Negative for itching and rash.  Neurological: Negative.  Negative for dizziness,  tingling, seizures, loss of consciousness and headaches.  Endo/Heme/Allergies: Negative.  Negative for polydipsia.  Psychiatric/Behavioral: Negative.  Negative for depression. The patient is not nervous/anxious.     Objective:   BP 130/86 (BP Location: Left Arm, Patient Position: Sitting, Cuff Size: Normal)   Pulse 69   Temp 98.3 F (36.8 C) (Temporal)   Ht 5' 1.5" (1.562 m)   Wt 154 lb 4 oz (70 kg)   SpO2 98%   BMI 28.67 kg/m  Body mass index is 28.67 kg/m.   General Appearance:    Alert, cooperative, no distress, appears stated age  Head:    Normocephalic, without obvious abnormality, atraumatic  Eyes:    PERRL, conjunctiva/corneas clear, EOM's intact, fundi    benign, both eyes  Ears:    Normal TM's and external ear canals, both ears  Nose:   Nares normal, septum midline, mucosa normal, no drainage    or sinus tenderness  Throat:   Lips, mucosa, and tongue normal; teeth and gums normal  Neck:   Supple, symmetrical, trachea midline, no adenopathy;    thyroid:  no enlargement/tenderness/nodules; no carotid   bruit or JVD  Back:     Symmetric, no curvature, ROM normal, no CVA tenderness  Lungs:     Clear to auscultation bilaterally, respirations unlabored  Chest Wall:    No tenderness or deformity   Heart:    Regular rate and rhythm, S1 and S2 normal, no murmur, rub or gallop  Breast Exam:    Deferred  Abdomen:     Soft, non-tender, bowel sounds active all four quadrants,    no masses, no organomegaly  Genitalia:    Deferred  Extremities:   Extremities normal, atraumatic, no cyanosis or edema  Pulses:   2+ and symmetric all extremities  Skin:   Skin color, texture, turgor normal, no rashes or lesions  Lymph nodes:   Cervical, supraclavicular, and axillary nodes normal  Neurologic:   CNII-XII intact, normal strength, sensation and reflexes    throughout     Assessment/Plan:   Tracey Moody was seen today for annual exam.  Diagnoses and all orders for this visit:  Routine  physical examination Today patient counseled on age appropriate routine health concerns for screening and prevention, each reviewed and up to date or declined. Immunizations reviewed and up to date or declined. Labs ordered and reviewed. Risk factors for depression reviewed and negative. Hearing function and visual acuity are intact. ADLs screened and addressed as needed. Functional ability and level of safety reviewed and appropriate. Education, counseling and referrals performed based on assessed risks today. Patient provided with a copy of personalized plan for preventive services.  Localized osteoporosis without current pathological fracture Will repeat DEXA scan next year.  Will recheck serum calcium and vitamin D due to Fosamax use. -     Comprehensive metabolic panel -     VITAMIN D 25 Hydroxy (Vit-D Deficiency, Fractures)  Encounter for lipid screening for cardiovascular disease -     Lipid panel  Vitamin D  deficiency Continue vitamin D supplement, will provide further recommendations if vitamin D levels are low.  Gastroesophageal reflux disease, unspecified whether esophagitis present Continue to avoid trigger foods.  If begins to worsen, may need to consider discontinuing Fosamax.  Overweight Has had limited exercise due to pandemic.  Recommended working on diet and exercise as able.   Well Adult Exam: Labs ordered: Yes. Patient counseling was done. See below for items discussed. Discussed the patient's BMI.  The BMI is not in the acceptable range; BMI management plan is completed Follow up in one year. Breast cancer screening: scheduled for next week. Cervical cancer screening: UTD   Patient Counseling: [x]    Nutrition: Stressed importance of moderation in sodium/caffeine intake, saturated fat and cholesterol, caloric balance, sufficient intake of fresh fruits, vegetables, fiber, calcium, iron, and 1 mg of folate supplement per day (for females capable of pregnancy).  [x]     Stressed the importance of regular exercise.   [x]    Substance Abuse: Discussed cessation/primary prevention of tobacco, alcohol, or other drug use; driving or other dangerous activities under the influence; availability of treatment for abuse.   [x]    Injury prevention: Discussed safety belts, safety helmets, smoke detector, smoking near bedding or upholstery.   [x]    Sexuality: Discussed sexually transmitted diseases, partner selection, use of condoms, avoidance of unintended pregnancy  and contraceptive alternatives.  [x]    Dental health: Discussed importance of regular tooth brushing, flossing, and dental visits.  [x]    Health maintenance and immunizations reviewed. Please refer to Health maintenance section.   CMA or LPN served as scribe during this visit. History, Physical, and Plan performed by medical provider. The above documentation has been reviewed and is accurate and complete.   Jarold MottoSamantha Raine Elsass, PA-C [facility]

## 2019-05-09 NOTE — Patient Instructions (Signed)
It was great to see you!  Please go to the lab for blood work.   Our office will call you with your results unless you have chosen to receive results via MyChart.  If your blood work is normal we will follow-up each year for physicals and as scheduled for chronic medical problems.  If anything is abnormal we will treat accordingly and get you in for a follow-up.  Take care,  Eddy Liszewski    Health Maintenance, Female Adopting a healthy lifestyle and getting preventive care are important in promoting health and wellness. Ask your health care provider about:  The right schedule for you to have regular tests and exams.  Things you can do on your own to prevent diseases and keep yourself healthy. What should I know about diet, weight, and exercise? Eat a healthy diet   Eat a diet that includes plenty of vegetables, fruits, low-fat dairy products, and lean protein.  Do not eat a lot of foods that are high in solid fats, added sugars, or sodium. Maintain a healthy weight Body mass index (BMI) is used to identify weight problems. It estimates body fat based on height and weight. Your health care provider can help determine your BMI and help you achieve or maintain a healthy weight. Get regular exercise Get regular exercise. This is one of the most important things you can do for your health. Most adults should:  Exercise for at least 150 minutes each week. The exercise should increase your heart rate and make you sweat (moderate-intensity exercise).  Do strengthening exercises at least twice a week. This is in addition to the moderate-intensity exercise.  Spend less time sitting. Even light physical activity can be beneficial. Watch cholesterol and blood lipids Have your blood tested for lipids and cholesterol at 64 years of age, then have this test every 5 years. Have your cholesterol levels checked more often if:  Your lipid or cholesterol levels are high.  You are older than 64  years of age.  You are at high risk for heart disease. What should I know about cancer screening? Depending on your health history and family history, you may need to have cancer screening at various ages. This may include screening for:  Breast cancer.  Cervical cancer.  Colorectal cancer.  Skin cancer.  Lung cancer. What should I know about heart disease, diabetes, and high blood pressure? Blood pressure and heart disease  High blood pressure causes heart disease and increases the risk of stroke. This is more likely to develop in people who have high blood pressure readings, are of African descent, or are overweight.  Have your blood pressure checked: ? Every 3-5 years if you are 18-39 years of age. ? Every year if you are 40 years old or older. Diabetes Have regular diabetes screenings. This checks your fasting blood sugar level. Have the screening done:  Once every three years after age 40 if you are at a normal weight and have a low risk for diabetes.  More often and at a younger age if you are overweight or have a high risk for diabetes. What should I know about preventing infection? Hepatitis B If you have a higher risk for hepatitis B, you should be screened for this virus. Talk with your health care provider to find out if you are at risk for hepatitis B infection. Hepatitis C Testing is recommended for:  Everyone born from 1945 through 1965.  Anyone with known risk factors for hepatitis C. Sexually   transmitted infections (STIs)  Get screened for STIs, including gonorrhea and chlamydia, if: ? You are sexually active and are younger than 64 years of age. ? You are older than 64 years of age and your health care provider tells you that you are at risk for this type of infection. ? Your sexual activity has changed since you were last screened, and you are at increased risk for chlamydia or gonorrhea. Ask your health care provider if you are at risk.  Ask your health  care provider about whether you are at high risk for HIV. Your health care provider may recommend a prescription medicine to help prevent HIV infection. If you choose to take medicine to prevent HIV, you should first get tested for HIV. You should then be tested every 3 months for as long as you are taking the medicine. Pregnancy  If you are about to stop having your period (premenopausal) and you may become pregnant, seek counseling before you get pregnant.  Take 400 to 800 micrograms (mcg) of folic acid every day if you become pregnant.  Ask for birth control (contraception) if you want to prevent pregnancy. Osteoporosis and menopause Osteoporosis is a disease in which the bones lose minerals and strength with aging. This can result in bone fractures. If you are 65 years old or older, or if you are at risk for osteoporosis and fractures, ask your health care provider if you should:  Be screened for bone loss.  Take a calcium or vitamin D supplement to lower your risk of fractures.  Be given hormone replacement therapy (HRT) to treat symptoms of menopause. Follow these instructions at home: Lifestyle  Do not use any products that contain nicotine or tobacco, such as cigarettes, e-cigarettes, and chewing tobacco. If you need help quitting, ask your health care provider.  Do not use street drugs.  Do not share needles.  Ask your health care provider for help if you need support or information about quitting drugs. Alcohol use  Do not drink alcohol if: ? Your health care provider tells you not to drink. ? You are pregnant, may be pregnant, or are planning to become pregnant.  If you drink alcohol: ? Limit how much you use to 0-1 drink a day. ? Limit intake if you are breastfeeding.  Be aware of how much alcohol is in your drink. In the [place] one drink equals one 12 oz bottle of beer (355 mL), one 5 oz glass of wine (148 mL), or one 1 oz glass of hard liquor (44 mL). General  instructions  Schedule regular health, dental, and eye exams.  Stay current with your vaccines.  Tell your health care provider if: ? You often feel depressed. ? You have ever been abused or do not feel safe at home. Summary  Adopting a healthy lifestyle and getting preventive care are important in promoting health and wellness.  Follow your health care provider's instructions about healthy diet, exercising, and getting tested or screened for diseases.  Follow your health care provider's instructions on monitoring your cholesterol and blood pressure. This information is not intended to replace advice given to you by your health care provider. Make sure you discuss any questions you have with your health care provider. Document Released: 01/30/2011 Document Revised: 07/10/2018 Document Reviewed: 07/10/2018 Elsevier Patient Education  2020 [employer]  

## 2019-05-12 ENCOUNTER — Other Ambulatory Visit: Payer: Self-pay | Admitting: Physician Assistant

## 2019-05-14 ENCOUNTER — Encounter: Payer: Self-pay | Admitting: Physician Assistant

## 2019-05-15 ENCOUNTER — Other Ambulatory Visit: Payer: Self-pay

## 2019-05-15 MED ORDER — ATORVASTATIN CALCIUM 40 MG PO TABS: 40.0000 mg | ORAL_TABLET | Freq: Every day | ORAL | 3 refills | Status: DC

## 2019-05-16 LAB — HM MAMMOGRAPHY

## 2019-06-02 ENCOUNTER — Encounter: Payer: Self-pay | Admitting: Physician Assistant

## 2019-06-17 ENCOUNTER — Other Ambulatory Visit: Payer: Self-pay

## 2019-06-17 ENCOUNTER — Encounter: Payer: Self-pay | Admitting: Physician Assistant

## 2019-06-17 MED ORDER — ALENDRONATE SODIUM 70 MG PO TABS: 70.0000 mg | ORAL_TABLET | ORAL | 0 refills | Status: DC

## 2019-08-27 ENCOUNTER — Encounter: Payer: Self-pay | Admitting: Physician Assistant

## 2019-08-27 MED ORDER — ALENDRONATE SODIUM 70 MG PO TABS: 70.0000 mg | ORAL_TABLET | ORAL | 3 refills | Status: DC

## 2019-08-27 MED ORDER — BACLOFEN 5 MG PO TABS: 5.0000 mg | ORAL_TABLET | Freq: Every evening | ORAL | 1 refills | Status: DC | PRN

## 2019-10-02 ENCOUNTER — Encounter: Payer: Self-pay | Admitting: Physician Assistant

## 2019-10-23 ENCOUNTER — Other Ambulatory Visit: Payer: Self-pay | Admitting: Physician Assistant

## 2019-10-29 ENCOUNTER — Encounter: Payer: Self-pay | Admitting: Physician Assistant

## 2019-12-21 ENCOUNTER — Other Ambulatory Visit: Payer: Self-pay | Admitting: Physician Assistant

## 2019-12-26 ENCOUNTER — Encounter: Payer: Self-pay | Admitting: Physician Assistant

## 2020-01-20 ENCOUNTER — Other Ambulatory Visit: Payer: Self-pay | Admitting: Physician Assistant

## 2020-01-20 NOTE — Telephone Encounter (Signed)
Okay to refill Baclofen?

## 2020-03-30 IMAGING — DX DG SACRUM/COCCYX 2+V
3 series · 3 of 3 positions shown · non-contrast
Comparison: None

CLINICAL DATA: Injury while standing on chair

EXAM:
SACRUM AND COCCYX - 2+ VIEW

[sacrum 20° caudo-cranial ap]
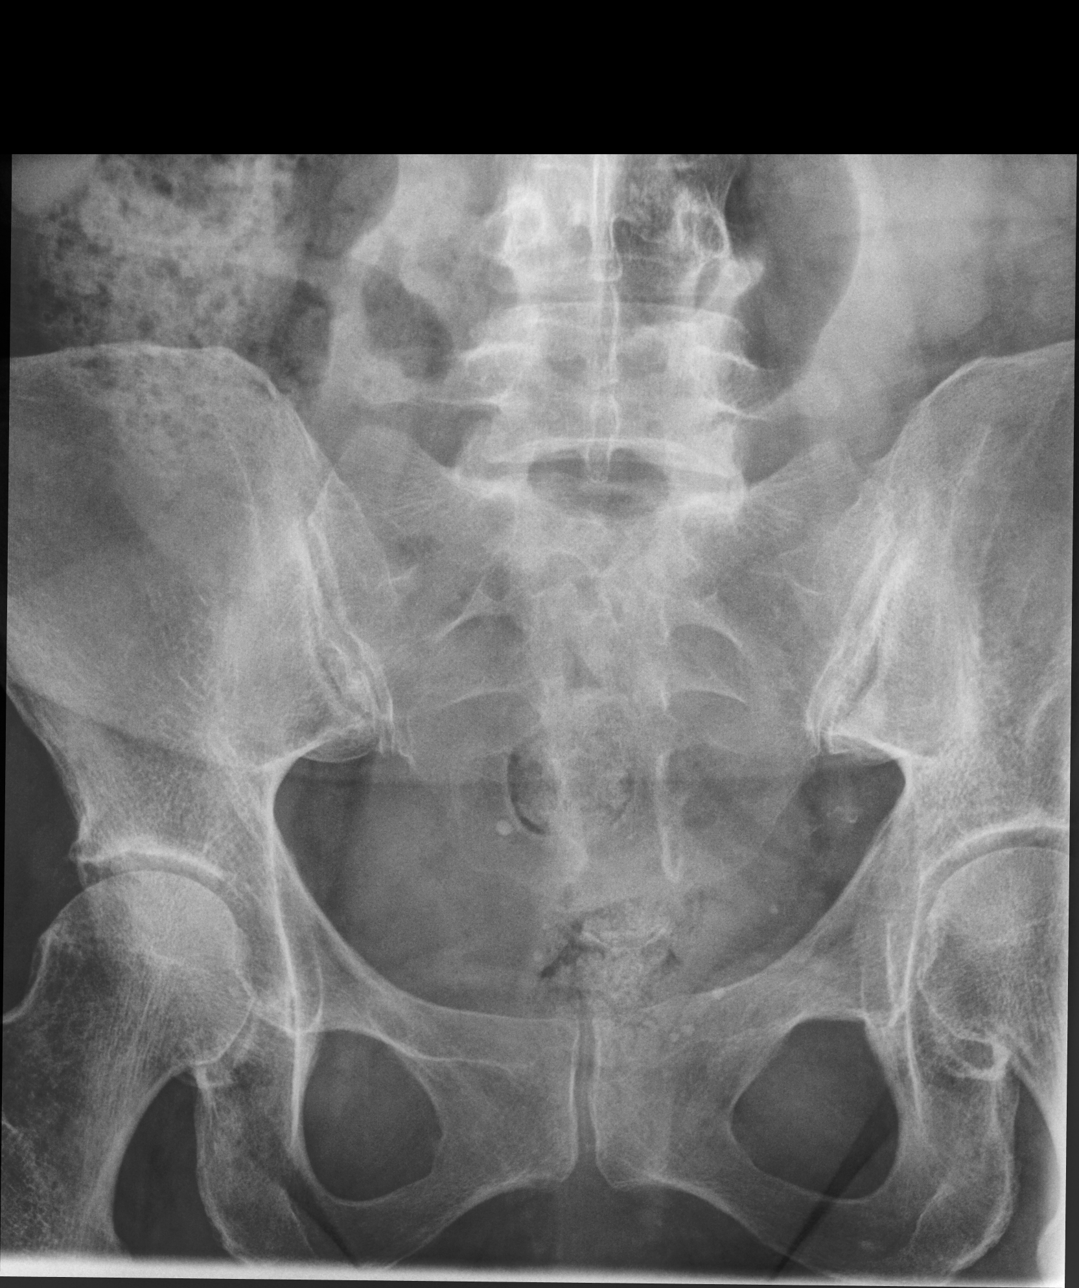

[coccyx ap]
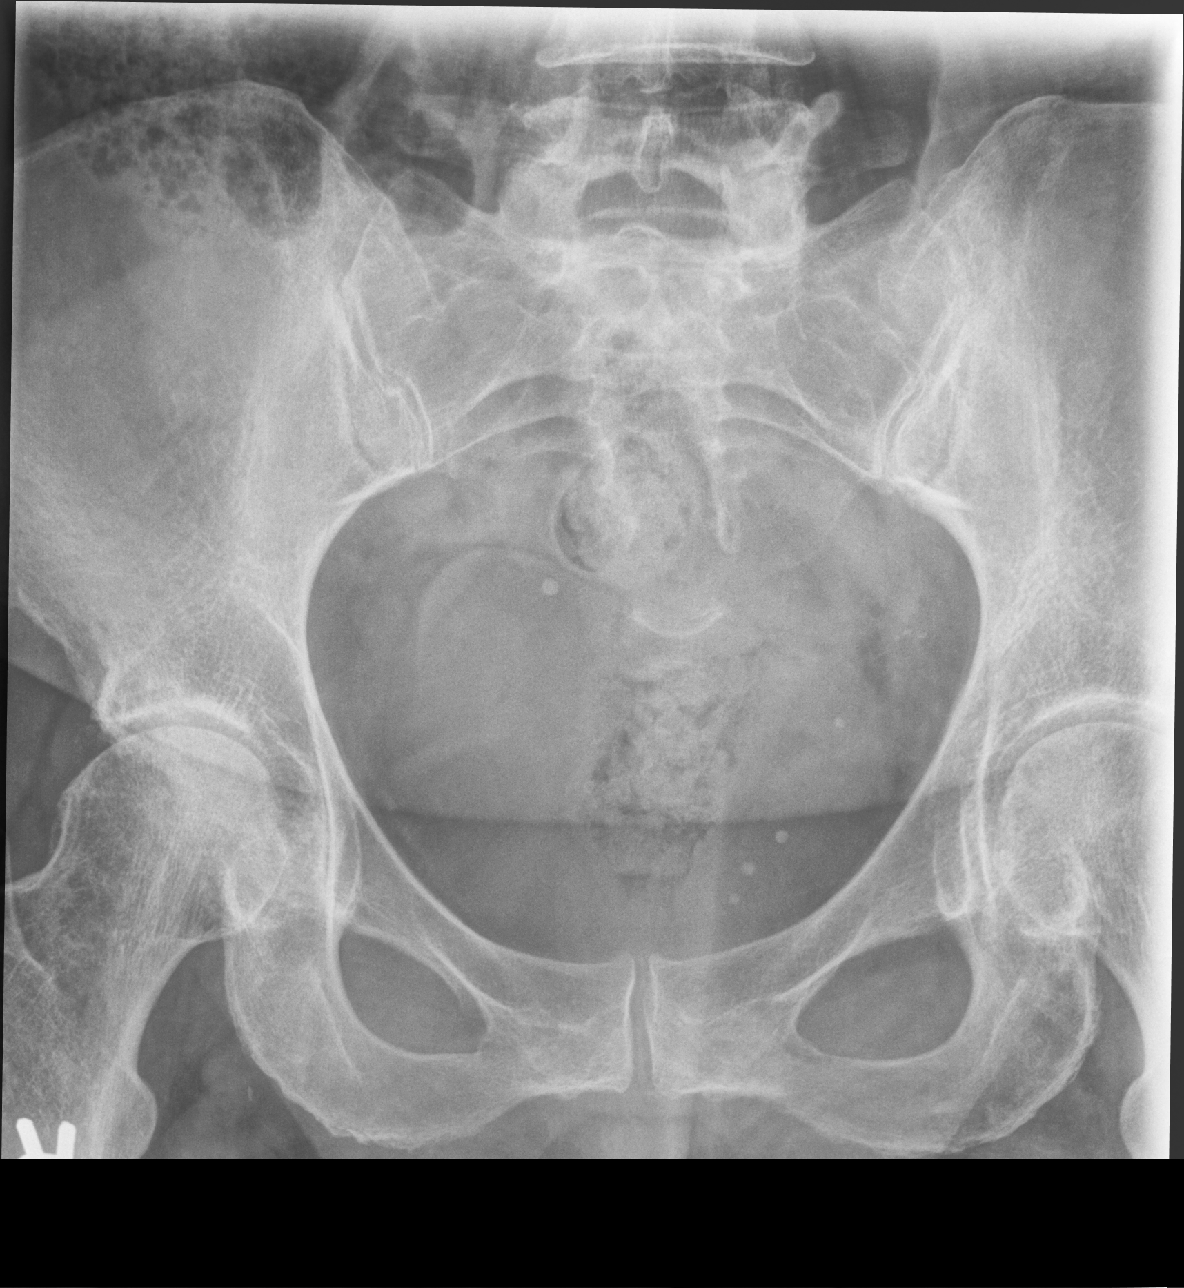

[sacrum lat]
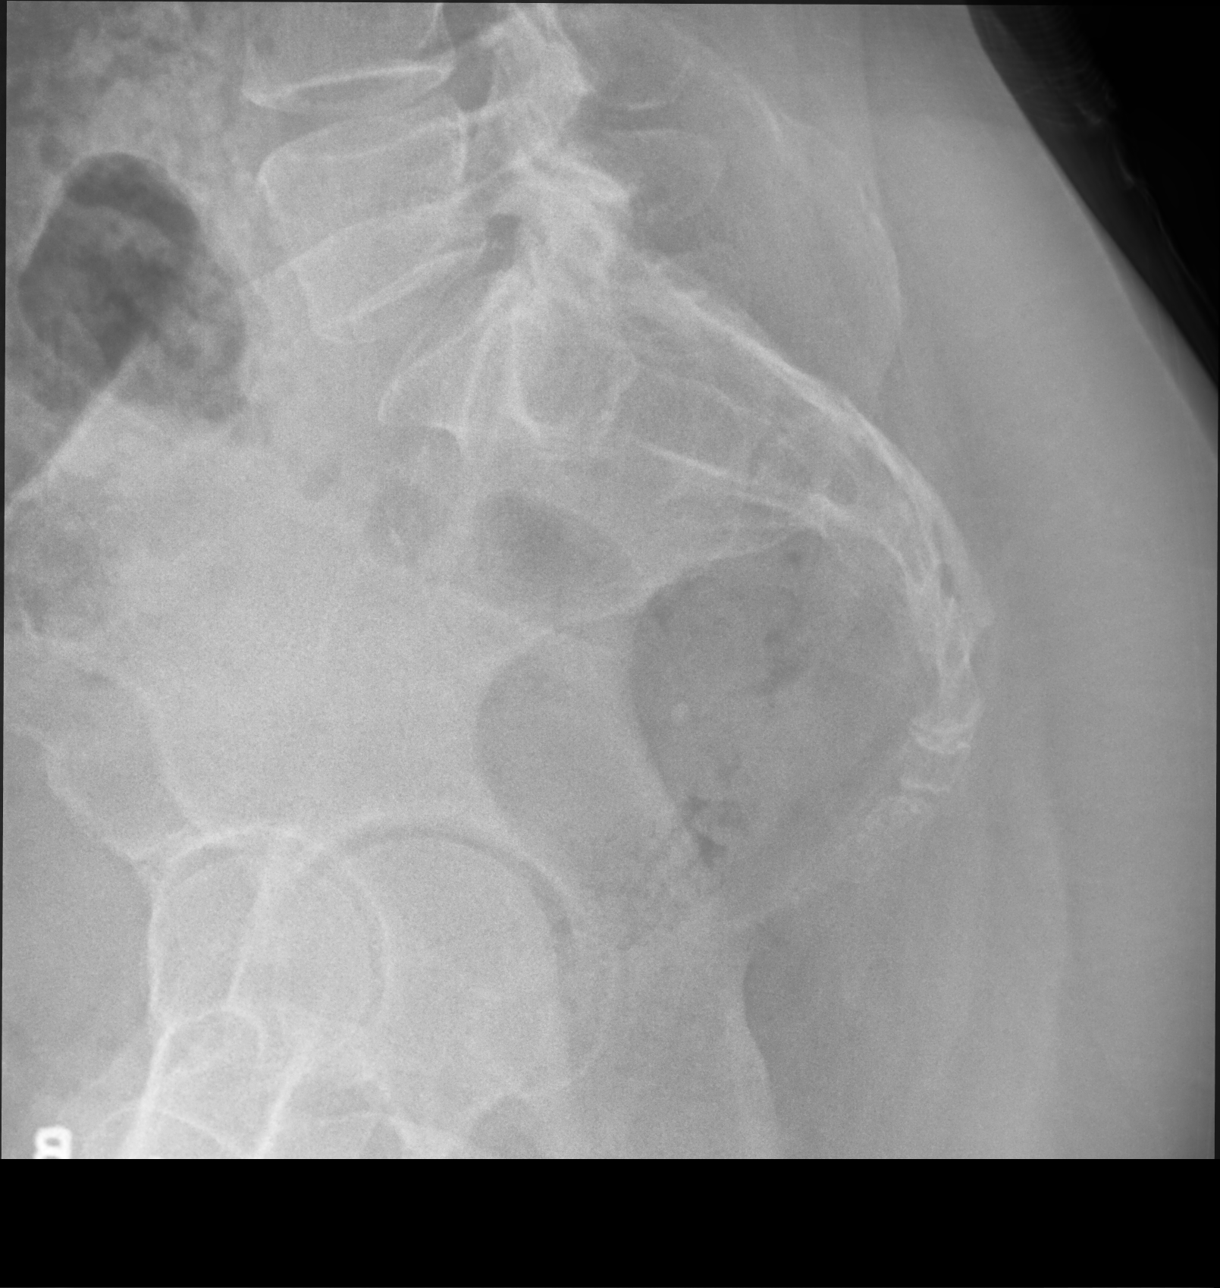

[3 of 3 positions shown; findings below may reference images not displayed]

FINDINGS: There is no evidence of fracture or other focal bone lesions.
IMPRESSION: Negative.

## 2020-05-26 ENCOUNTER — Other Ambulatory Visit: Payer: Self-pay | Admitting: Family Medicine

## 2020-05-31 LAB — HM MAMMOGRAPHY

## 2020-05-31 LAB — HM DEXA SCAN

## 2020-06-01 ENCOUNTER — Other Ambulatory Visit: Payer: Self-pay

## 2020-06-01 ENCOUNTER — Ambulatory Visit (INDEPENDENT_AMBULATORY_CARE_PROVIDER_SITE_OTHER): Payer: Medicare Other | Admitting: Physician Assistant

## 2020-06-01 ENCOUNTER — Encounter: Payer: Self-pay | Admitting: Physician Assistant

## 2020-06-01 VITALS — BP 120/82 | HR 78 | Temp 97.9°F | Ht 61.5 in | Wt 154.0 lb

## 2020-06-01 DIAGNOSIS — E663 Overweight: Secondary | ICD-10-CM | POA: Diagnosis not present

## 2020-06-01 DIAGNOSIS — M816 Localized osteoporosis [Lequesne]: Secondary | ICD-10-CM | POA: Diagnosis not present

## 2020-06-01 DIAGNOSIS — Z0000 Encounter for general adult medical examination without abnormal findings: Secondary | ICD-10-CM

## 2020-06-01 DIAGNOSIS — E785 Hyperlipidemia, unspecified: Secondary | ICD-10-CM | POA: Insufficient documentation

## 2020-06-01 DIAGNOSIS — R000 Tachycardia, unspecified: Secondary | ICD-10-CM

## 2020-06-01 DIAGNOSIS — R001 Bradycardia, unspecified: Secondary | ICD-10-CM

## 2020-06-01 NOTE — Patient Instructions (Addendum)
It was great to see you!  I will be in touch with your lab results.  EKG done today.  Please review and complete your advanced directives.   Adult vaccines due  Topic Date Due  . TETANUS/TDAP  09/19/2023    Health Maintenance, Female Adopting a healthy lifestyle and getting preventive care are important in promoting health and wellness. Ask your health care provider about:  The right schedule for you to have regular tests and exams.  Things you can do on your own to prevent diseases and keep yourself healthy. What should I know about diet, weight, and exercise? Eat a healthy diet   Eat a diet that includes plenty of vegetables, fruits, low-fat dairy products, and lean protein.  Do not eat a lot of foods that are high in solid fats, added sugars, or sodium. Maintain a healthy weight Body mass index (BMI) is used to identify weight problems. It estimates body fat based on height and weight. Your health care provider can help determine your BMI and help you achieve or maintain a healthy weight. Get regular exercise Get regular exercise. This is one of the most important things you can do for your health. Most adults should:  Exercise for at least 150 minutes each week. The exercise should increase your heart rate and make you sweat (moderate-intensity exercise).  Do strengthening exercises at least twice a week. This is in addition to the moderate-intensity exercise.  Spend less time sitting. Even light physical activity can be beneficial. Watch cholesterol and blood lipids Have your blood tested for lipids and cholesterol at 65 years of age, then have this test every 5 years. Have your cholesterol levels checked more often if:  Your lipid or cholesterol levels are high.  You are older than 65 years of age.  You are at high risk for heart disease. What should I know about cancer screening? Depending on your health history and family history, you may need to have cancer  screening at various ages. This may include screening for:  Breast cancer.  Cervical cancer.  Colorectal cancer.  Skin cancer.  Lung cancer. What should I know about heart disease, diabetes, and high blood pressure? Blood pressure and heart disease  High blood pressure causes heart disease and increases the risk of stroke. This is more likely to develop in people who have high blood pressure readings, are of African descent, or are overweight.  Have your blood pressure checked: ? Every 3-5 years if you are 18-85 years of age. ? Every year if you are 48 years old or older. Diabetes Have regular diabetes screenings. This checks your fasting blood sugar level. Have the screening done:  Once every three years after age 77 if you are at a normal weight and have a low risk for diabetes.  More often and at a younger age if you are overweight or have a high risk for diabetes. What should I know about preventing infection? Hepatitis B If you have a higher risk for hepatitis B, you should be screened for this virus. Talk with your health care provider to find out if you are at risk for hepatitis B infection. Hepatitis C Testing is recommended for:  Everyone born from [birth year] through 1965.  Anyone with known risk factors for hepatitis C. Sexually transmitted infections (STIs)  Get screened for STIs, including gonorrhea and chlamydia, if: ? You are sexually active and are younger than 65 years of age. ? You are older than 65 years of  age and your health care provider tells you that you are at risk for this type of infection. ? Your sexual activity has changed since you were last screened, and you are at increased risk for chlamydia or gonorrhea. Ask your health care provider if you are at risk.  Ask your health care provider about whether you are at high risk for HIV. Your health care provider may recommend a prescription medicine to help prevent HIV infection. If you choose to take  medicine to prevent HIV, you should first get tested for HIV. You should then be tested every 3 months for as long as you are taking the medicine. Pregnancy  If you are about to stop having your period (premenopausal) and you may become pregnant, seek counseling before you get pregnant.  Take 400 to 800 micrograms (mcg) of folic acid every day if you become pregnant.  Ask for birth control (contraception) if you want to prevent pregnancy. Osteoporosis and menopause Osteoporosis is a disease in which the bones lose minerals and strength with aging. This can result in bone fractures. If you are 38 years old or older, or if you are at risk for osteoporosis and fractures, ask your health care provider if you should:  Be screened for bone loss.  Take a calcium or vitamin D supplement to lower your risk of fractures.  Be given hormone replacement therapy (HRT) to treat symptoms of menopause. Follow these instructions at home: Lifestyle  Do not use any products that contain nicotine or tobacco, such as cigarettes, e-cigarettes, and chewing tobacco. If you need help quitting, ask your health care provider.  Do not use street drugs.  Do not share needles.  Ask your health care provider for help if you need support or information about quitting drugs. Alcohol use  Do not drink alcohol if: ? Your health care provider tells you not to drink. ? You are pregnant, may be pregnant, or are planning to become pregnant.  If you drink alcohol: ? Limit how much you use to 0-1 drink a day. ? Limit intake if you are breastfeeding.  Be aware of how much alcohol is in your drink. In the [place] one drink equals one 12 oz bottle of beer (355 mL), one 5 oz glass of wine (148 mL), or one 1 oz glass of hard liquor (44 mL). General instructions  Schedule regular health, dental, and eye exams.  Stay current with your vaccines.  Tell your health care provider if: ? You often feel depressed. ? You have  ever been abused or do not feel safe at home. Summary  Adopting a healthy lifestyle and getting preventive care are important in promoting health and wellness.  Follow your health care provider's instructions about healthy diet, exercising, and getting tested or screened for diseases.  Follow your health care provider's instructions on monitoring your cholesterol and blood pressure. This information is not intended to replace advice given to you by your health care provider. Make sure you discuss any questions you have with your health care provider. Document Revised: 07/10/2018 Document Reviewed: 07/10/2018 Elsevier Patient Education  2020 [employer].

## 2020-06-01 NOTE — Assessment & Plan Note (Signed)
Update lipid panel today. Currently tolerating Lipitor 40 mg daily. Will provide additional recommendations as indicated.

## 2020-06-01 NOTE — Assessment & Plan Note (Signed)
EKG tracing is personally reviewed.  EKG notes NSR.  No acute changes.  Asymptomatic. Will continue to monitor as needed.

## 2020-06-01 NOTE — Assessment & Plan Note (Signed)
Tolerating fosamax without concerns. Completed DEXA yesterday, currently awaiting results. Will provide recommendations as indicated once results have returned.

## 2020-06-01 NOTE — Assessment & Plan Note (Signed)
Has very healthy lifestyle. Encouraged patient to continue healthy diet and exercise.

## 2020-06-01 NOTE — Progress Notes (Addendum)
Phone: [phone]    I acted as a Neurosurgeonscribe for Energy East CorporationSamantha Lillymae Duet, PA-C Corky Mullonna Orphanos, LPN   Subjective:  Patient presents today for their Welcome to Medicare Exam    Preventive Screening-Counseling & Management  Vision screen: normal; she just completed formal eye exam with her doctor  Visual Acuity Screening   Right eye Left eye Both eyes  Without correction:     With correction: 20/20 20/20 20/20    Hearing screen: Passed whisper test in office and had no perceived hearing abnormalities throughout the encounter.   Advanced directives: information given today  Modifiable Risk Factors/behavioral risk assessment/psychosocial risk assessment Regular exercise: walks regularly, takes care of medically fragile children Diet: balanced breakfast, fruit at lunch, dinner balanced (eats at home mostly)  Wt Readings from Last 3 Encounters:  06/01/20 154 lb (69.9 kg)  05/09/19 154 lb 4 oz (70 kg)  10/23/18 149 lb (67.6 kg)  Body mass index is 28.63 kg/m.  Smoking Status: Never Smoker Second Hand Smoking status: No smokers in home Alcohol intake: once every 2-3 months glass of wine or beer.  Cardiac risk factors:  advanced age (older than 7855 for men, 7465 for women) -- technically is 64 years old Yes -- Hyperlipidemia -- takes lipitor 40 mg No -- Hypertension No diabetes. none No results found for: HGBA1C Family History: father had HTN, CABGs; siblings with HLD; sister with MI (was a smoker, overweight)   Family History  Problem Relation Age of Onset  . Lung cancer Mother   . Osteoporosis Mother   . Skin cancer Father   . Hypertension Father   . Hypercholesterolemia Sister   . Hypercholesterolemia Brother   . CVA Maternal Grandmother   . Heart attack Sister   . Hypercholesterolemia Brother   . Hypercholesterolemia Sister   . Breast cancer Neg Hx     Depression Screen/risk evaluation Risk factors: hx of mild depression.Marland Kitchen. PHQ9 -- 3 Depression screen [facility] 2/9 06/01/2020  05/09/2019 12/13/2018 10/17/2017 10/17/2017  Decreased Interest 0 0 0 - 0  Down, Depressed, Hopeless 0 0 0 0 0  PHQ - 2 Score 0 0 0 0 0  Altered sleeping 2 1 1  0 -  Tired, decreased energy 1 1 1 3  -  Change in appetite 0 0 0 1 -  Feeling bad or failure about yourself  0 0 0 0 -  Trouble concentrating 0 0 0 0 -  Moving slowly or fidgety/restless 0 0 0 0 -  Suicidal thoughts 0 0 0 0 -  PHQ-9 Score 3 2 2 4  -  Difficult doing work/chores Not difficult at all Not difficult at all - Not difficult at all -    Functional ability and level of safety Mobility assessment:  timed get up and go <12 seconds Activities of Daily Living- Independent in ADLs (toileting, bathing, dressing, transferring, eating) and in IADLs (shopping, housekeeping, managing own medications, and handling finances) Home Safety: Loose rugs (does have a few at home), smoke detectors (yes), small pets (none), grab bars (none), stairs (none), life-alert system (none) Hearing Difficulties: patient declines Fall Risk:  None  No flowsheet data found. Opioid use history: no long term opioids use Self assessment of health status: pretty good  Required Immunizations needed today:  UTD; not getting the booster for COVID Immunization History  Administered Date(s) Administered  . Influenza,inj,Quad PF,6+ Mos 05/01/2017, 05/06/2018, 05/16/2020  . Influenza-Unspecified 04/29/2019  . PFIZER SARS-COV-2 Vaccination 09/11/2019, 10/10/2019  . Tdap 09/18/2013  . Zoster Recombinat (Shingrix) 06/26/2018,  08/27/2018   Health Maintenance  Topic Date Due  . Colon Cancer Screening  09/22/2013  . Pap Smear  03/01/2020  . DEXA scan (bone density measurement)  05/20/2020  . Mammogram  05/31/2021  . Tetanus Vaccine  09/19/2023  . Flu Shot  Completed  . COVID-19 Vaccine  Completed  .  Hepatitis C: One time screening is recommended by Center for Disease Control  (CDC) for  adults born from [birth year] through 1966.   Completed  . HIV Screening   Completed    Screening tests-  Health Maintenance Due  Topic Date Due  . COLONOSCOPY  09/22/2013  . PAP SMEAR-Modifier  03/01/2020  . DEXA SCAN  05/20/2020   1. Colon cancer screening- last one in 2010, overdue 2. Lung Cancer screening- not eligible, never smoker 3. Skin cancer screening- UTD, sees derm annually 4. Cervical cancer screening- UTD, saw gyn 05/31/20 5. Breast cancer screening- UTD, saw gyn 05/31/20     The following were reviewed and entered/updated in epic: History reviewed. No pertinent past medical history. Patient Active Problem List   Diagnosis Date Noted  . Overweight 06/01/2020  . Hyperlipidemia 06/01/2020  . Bradycardia 06/01/2020  . Osteoporosis 05/22/2018  . Depression, major, single episode, mild (HCC) 02/15/2017  . Gastritis 02/15/2017  . Vitamin D deficiency 02/15/2017  . Chronic pain of both knees 02/15/2017  . Solar lentigo 02/15/2017   Past Surgical History:  Procedure Laterality Date  . APPENDECTOMY  1967  . CESAREAN SECTION  1979, 1980, 66, 1984, 1987  . TONSILLECTOMY  1960    Family History  Problem Relation Age of Onset  . Lung cancer Mother   . Osteoporosis Mother   . Skin cancer Father   . Hypertension Father   . Hypercholesterolemia Sister   . Hypercholesterolemia Brother   . CVA Maternal Grandmother   . Heart attack Sister   . Hypercholesterolemia Brother   . Hypercholesterolemia Sister   . Breast cancer Neg Hx     Medications- reviewed and updated Current Outpatient Medications  Medication Sig Dispense Refill  . alendronate (FOSAMAX) 70 MG tablet Take 1 tablet (70 mg total) by mouth every 7 (seven) days. Take with a full glass of water on an empty stomach. 12 tablet 3  . aspirin EC 81 MG tablet Take 81 mg by mouth daily.    Marland Kitchen atorvastatin (LIPITOR) 40 MG tablet TAKE ONE TABLET BY MOUTH DAILY 90 tablet 3  . b complex vitamins tablet Take 1 tablet by mouth daily.    . Baclofen 5 MG TABS TAKE ONE TABLET BY MOUTH AT  BEDTIME AS NEEDED 30 tablet 0  . BIOTIN PO Take 1 tablet by mouth daily.    . Calcium Citrate (CITRACAL PO) Take 1,200 mg by mouth daily.     . Cholecalciferol (VITAMIN D3) 2000 units TABS Take 1 tablet by mouth daily.    . Echinacea 500 MG CAPS Take 2 capsules by mouth daily.    . famotidine (PEPCID) 20 MG tablet TAKE 1 TABLET DAILY 90 tablet 1  . naproxen sodium (ANAPROX) 220 MG tablet Take 220 mg by mouth as needed.    . Omega-3 Fatty Acids (FISH OIL) 1000 MG CPDR Take 1 capsule by mouth daily.    . Turmeric 500 MG CAPS Take 1 capsule by mouth daily.     No current facility-administered medications for this visit.    Allergies-reviewed and updated No Known Allergies  Social History   Socioeconomic History  . Marital status:  Married    Spouse name: Not on file  . Number of children: Not on file  . Years of education: Not on file  . Highest education level: Not on file  Occupational History  . Not on file  Tobacco Use  . Smoking status: Never Smoker  . Smokeless tobacco: Never Used  Vaping Use  . Vaping Use: Never used  Substance and Sexual Activity  . Alcohol use: Yes    Comment: occassionally beer  . Drug use: No  . Sexual activity: Yes    Birth control/protection: Other-see comments    Comment: Husband Vasectomy  Other Topics Concern  . 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 Difficulty of Paying Living Expenses: Not on file  Food Insecurity:   . Worried About Programme researcher, broadcasting/film/video in the Last Year: Not on file  . Ran Out of Food in the Last Year: Not on file  Transportation Needs:   . Lack of Transportation (Medical): Not on file  . Lack of Transportation (Non-Medical): Not on file  Physical Activity:   . Days of Exercise per Week: Not on file  . Minutes of Exercise per Session:  Not on file  Stress:   . Feeling of Stress : Not on file  Social Connections:   . Frequency of Communication with Friends and Family: Not on file  . Frequency of Social Gatherings with Friends and Family: Not on file  . Attends Religious Services: Not on file  . Active Member of Clubs or Organizations: Not on file  . Attends Banker Meetings: Not on file  . Marital Status: Not on file   Objective  Objective:  BP 120/82 (BP Location: Left Arm, Patient Position: Sitting, Cuff Size: Normal)   Pulse 78   Temp 97.9 F (36.6 C) (Temporal)   Ht 5' 1.5" (1.562 m)   Wt 154 lb (69.9 kg)   SpO2 99%   BMI 28.63 kg/m  Gen: NAD, resting comfortably HEENT: Mucous membranes are moist. Oropharynx normal Neck: no thyromegaly CV: RRR no murmurs rubs or gallops Lungs: CTAB no crackles, wheeze, rhonchi Abdomen: soft/nontender/nondistended/normal bowel sounds. No rebound or guarding.  Ext: no edema Skin: warm, dry Neuro: grossly normal, moves all extremities, PERRLA   Assessment and Plan:   Welcome to Medicare exam completed-  1. Educated, counseled and referred based on above elements 2. Educated, counseled and referred as appropriate for preventative needs 3. Discussed and documented a written plan for preventiative services and screenings with personalized health advice- After Visit Summary was given to patient which included this plan -- complete Advanced Directives and update labs 4. EKG offered [id number]- patient consented  Status of chronic or acute concerns  No acute concerns  Osteoporosis Tolerating fosamax without concerns. Completed DEXA yesterday, currently awaiting results. Will provide recommendations as indicated once results have returned.  Hyperlipidemia Update lipid panel today. Currently tolerating Lipitor 40 mg daily. Will provide additional recommendations as indicated.  Bradycardia EKG tracing is personally reviewed.  EKG notes NSR.  No acute  changes.  Asymptomatic. Will continue to monitor as needed.  Overweight Has very healthy lifestyle. Encouraged patient to continue healthy diet and exercise.   Recommended follow up: 1 year No future appointments.   Lab/Order associations:  ICD-10-CM   1. Welcome to Medicare preventive visit  Z00.00   2. Hyperlipidemia, unspecified hyperlipidemia type  E78.5 Lipid panel    Lipid panel  3. Localized osteoporosis without current pathological fracture  M81.6   4. Overweight  E66.3   5. Bradycardia  R00.1 CBC with Differential/Platelet    Comprehensive metabolic panel    EKG 12-Lead    Comprehensive metabolic panel    CBC with Differential/Platelet    No orders of the defined types were placed in this encounter.   Return precautions advised. Jarold Motto, [place]

## 2020-06-02 LAB — COMPREHENSIVE METABOLIC PANEL
AG Ratio: 2.1 (calc) (ref 1.0–2.5)
ALT: 14 U/L (ref 6–29)
AST: 16 U/L (ref 10–35)
Albumin: 4.6 g/dL (ref 3.6–5.1)
Alkaline phosphatase (APISO): 51 U/L (ref 37–153)
BUN: 14 mg/dL (ref 7–25)
CO2: 26 mmol/L (ref 20–32)
Calcium: 9.4 mg/dL (ref 8.6–10.4)
Chloride: 106 mmol/L (ref 98–110)
Creat: 0.61 mg/dL (ref 0.50–0.99)
Globulin: 2.2 g/dL (calc) (ref 1.9–3.7)
Glucose, Bld: 92 mg/dL (ref 65–99)
Potassium: 4.5 mmol/L (ref 3.5–5.3)
Sodium: 140 mmol/L (ref 135–146)
Total Bilirubin: 0.7 mg/dL (ref 0.2–1.2)
Total Protein: 6.8 g/dL (ref 6.1–8.1)

## 2020-06-02 LAB — CBC WITH DIFFERENTIAL/PLATELET
Absolute Monocytes: 317 cells/uL (ref 200–950)
Basophils Absolute: 43 cells/uL (ref 0–200)
Basophils Relative: 0.7 %
Eosinophils Absolute: 207 cells/uL (ref 15–500)
Eosinophils Relative: 3.4 %
HCT: 38.7 % (ref 35.0–45.0)
Hemoglobin: 13.2 g/dL (ref 11.7–15.5)
Lymphs Abs: 2062 cells/uL (ref 850–3900)
MCH: 31.4 pg (ref 27.0–33.0)
MCHC: 34.1 g/dL (ref 32.0–36.0)
MCV: 91.9 fL (ref 80.0–100.0)
MPV: 11 fL (ref 7.5–12.5)
Monocytes Relative: 5.2 %
Neutro Abs: 3471 cells/uL (ref 1500–7800)
Neutrophils Relative %: 56.9 %
Platelets: 232 10*3/uL (ref 140–400)
RBC: 4.21 10*6/uL (ref 3.80–5.10)
RDW: 12.8 % (ref 11.0–15.0)
Total Lymphocyte: 33.8 %
WBC: 6.1 10*3/uL (ref 3.8–10.8)

## 2020-06-02 LAB — LIPID PANEL
Cholesterol: 205 mg/dL — ABNORMAL HIGH (ref <200)
HDL: 80 mg/dL (ref >=50)
LDL Cholesterol (Calc): 108 mg/dL (calc) — ABNORMAL HIGH
Non-HDL Cholesterol (Calc): 125 mg/dL (calc) (ref <130)
Total CHOL/HDL Ratio: 2.6 (calc) (ref <5.0)
Triglycerides: 81 mg/dL (ref <150)

## 2020-06-09 ENCOUNTER — Encounter: Payer: Self-pay | Admitting: Physician Assistant

## 2020-07-29 ENCOUNTER — Encounter: Payer: Self-pay | Admitting: Physician Assistant

## 2020-08-03 ENCOUNTER — Telehealth (INDEPENDENT_AMBULATORY_CARE_PROVIDER_SITE_OTHER): Payer: Medicare Other | Admitting: Physician Assistant

## 2020-08-03 ENCOUNTER — Encounter: Payer: Self-pay | Admitting: Physician Assistant

## 2020-08-03 ENCOUNTER — Other Ambulatory Visit: Payer: Self-pay

## 2020-08-03 VITALS — Temp 97.5°F | Ht 61.5 in | Wt 154.0 lb

## 2020-08-03 DIAGNOSIS — Z20822 Contact with and (suspected) exposure to covid-19: Secondary | ICD-10-CM

## 2020-08-03 MED ORDER — FLUTICASONE PROPIONATE HFA 44 MCG/ACT IN AERO: 1.0000 | INHALATION_SPRAY | Freq: Two times a day (BID) | RESPIRATORY_TRACT | 1 refills | Status: DC

## 2020-08-03 MED ORDER — ALBUTEROL SULFATE HFA 108 (90 BASE) MCG/ACT IN AERS: 2.0000 | INHALATION_SPRAY | Freq: Four times a day (QID) | RESPIRATORY_TRACT | 1 refills | Status: DC | PRN

## 2020-08-03 NOTE — Progress Notes (Signed)
Virtual Visit via Video   I connected with Tracey Moody on 08/03/20 at  4:00 PM EST by a video enabled telemedicine application and verified that I am speaking with the correct person using two identifiers. Location patient: Home Location provider: [facility], Office Persons participating in the virtual visit: DERRIANA OSER, Jarold Motto PA-C, Corky Mull, LPN   I discussed the limitations of evaluation and management by telemedicine and the availability of in person appointments. The patient expressed understanding and agreed to proceed.  I acted as a Neurosurgeon for [employer], PA-C [employer], LPN   Subjective:   HPI:   Patient is requesting evaluation for possible COVID-19.  Symptom onset: Dec 27th  Travel/contacts: think exposed to daughter and son in law, not been tested.   Vaccination status: Two  Patient endorses the following symptoms: Fever (99.8, taking Tylenol and Ibuprofen q 6 hours due to headache.), sinus headache, dry cough (non-productive ) and myalgia  Loss of taste and smell on Friday  Patient denies the following symptoms: sinus congestion, sinus pain, rhinorrhea, ear pain, sore throat, wheezing, shortness of breath, chest tightness and chest pain  Treatments tried: Tylenol and Ibuprofen Delsym made her feel faint  Patient risk factors: Current COVID-19 risk of complications score: 1 Smoking status: Tracey Moody  reports that she has never smoked. She has never used smokeless tobacco. If female, currently pregnant? []   Yes [x]   No  ROS: See pertinent positives and negatives per HPI.  Patient Active Problem List   Diagnosis Date Noted  . Overweight 06/01/2020  . Hyperlipidemia 06/01/2020  . Bradycardia 06/01/2020  . Osteoporosis 05/22/2018  . Depression, major, single episode, mild (HCC) 02/15/2017  . Gastritis 02/15/2017  . Vitamin D deficiency 02/15/2017  . Chronic pain of both knees 02/15/2017  . Solar  lentigo 02/15/2017    Social History   Tobacco Use  . Smoking status: Never Smoker  . Smokeless tobacco: Never Used  Substance Use Topics  . Alcohol use: Yes    Comment: occassionally beer    Current Outpatient Medications:  .  albuterol (VENTOLIN HFA) 108 (90 Base) MCG/ACT inhaler, Inhale 2 puffs into the lungs every 6 (six) hours as needed for wheezing or shortness of breath., Disp: 8 g, Rfl: 1 .  alendronate (FOSAMAX) 70 MG tablet, Take 1 tablet (70 mg total) by mouth every 7 (seven) days. Take with a full glass of water on an empty stomach., Disp: 12 tablet, Rfl: 3 .  aspirin EC 81 MG tablet, Take 81 mg by mouth daily., Disp: , Rfl:  .  atorvastatin (LIPITOR) 40 MG tablet, TAKE ONE TABLET BY MOUTH DAILY, Disp: 90 tablet, Rfl: 3 .  b complex vitamins tablet, Take 1 tablet by mouth daily., Disp: , Rfl:  .  Baclofen 5 MG TABS, TAKE ONE TABLET BY MOUTH AT BEDTIME AS NEEDED, Disp: 30 tablet, Rfl: 0 .  BIOTIN PO, Take 1 tablet by mouth daily., Disp: , Rfl:  .  Calcium Citrate (CITRACAL PO), Take 1,200 mg by mouth daily. , Disp: , Rfl:  .  Cholecalciferol (VITAMIN D3) 2000 units TABS, Take 1 tablet by mouth daily., Disp: , Rfl:  .  Echinacea 500 MG CAPS, Take 2 capsules by mouth daily., Disp: , Rfl:  .  famotidine (PEPCID) 20 MG tablet, TAKE 1 TABLET DAILY, Disp: 90 tablet, Rfl: 1 .  fluticasone (FLOVENT HFA) 44 MCG/ACT inhaler, Inhale 1 puff into the lungs in the morning and at bedtime., Disp:  1 each, Rfl: 1 .  naproxen sodium (ANAPROX) 220 MG tablet, Take 220 mg by mouth as needed., Disp: , Rfl:  .  Omega-3 Fatty Acids (FISH OIL) 1000 MG CPDR, Take 1 capsule by mouth daily., Disp: , Rfl:  .  Turmeric 500 MG CAPS, Take 1 capsule by mouth daily., Disp: , Rfl:   No Known Allergies  Objective:   VITALS: Per patient if applicable, see vitals. GENERAL: Alert, appears well and in no acute distress. HEENT: Atraumatic, conjunctiva clear, no obvious abnormalities on inspection of external  nose and ears. NECK: Normal movements of the head and neck. CARDIOPULMONARY: No increased WOB. Speaking in clear sentences. I:E ratio WNL.  MS: Moves all visible extremities without noticeable abnormality. PSYCH: Pleasant and cooperative, well-groomed. Speech normal rate and rhythm. Affect is appropriate. Insight and judgement are appropriate. Attention is focused, linear, and appropriate.  NEURO: CN grossly intact. Oriented as arrived to appointment on time with no prompting. Moves both UE equally.  SKIN: No obvious lesions, wounds, erythema, or cyanosis noted on face or hands.  Assessment and Plan:   [name] was seen today for covid symptoms.  Diagnoses and all orders for this visit:  Suspected COVID-19 virus infection  Other orders -     fluticasone (FLOVENT HFA) 44 MCG/ACT inhaler; Inhale 1 puff into the lungs in the morning and at bedtime. -     albuterol (VENTOLIN HFA) 108 (90 Base) MCG/ACT inhaler; Inhale 2 puffs into the lungs every 6 (six) hours as needed for wheezing or shortness of breath.   No red flags on discussion, patient is not in any obvious distress during our visit. Discussed progression of most viral illness, and recommended supportive care at this point in time. Will trial flovent BID and albuterol prn. Discussed over the counter supportive care options, with recommendations to push fluids and rest. Reviewed return precautions including new/worsening fever, SOB, new/worsening cough or other concerns.  Recommended need to self-quarantine and practice social distancing until symptoms resolve. I recommend that patient follow-up if symptoms worsen or persist despite treatment x 7-10 days, sooner if needed.  I discussed the assessment and treatment plan with the patient. The patient was provided an opportunity to ask questions and all were answered. The patient agreed with the plan and demonstrated an understanding of the instructions.   The patient was advised to call  back or seek an in-person evaluation if the symptoms worsen or if the condition fails to improve as anticipated.   CMA or LPN served as scribe during this visit. History, Physical, and Plan performed by medical provider. The above documentation has been reviewed and is accurate and complete.   [place], [place] 08/03/2020

## 2020-08-08 ENCOUNTER — Encounter: Payer: Self-pay | Admitting: Physician Assistant

## 2020-08-08 ENCOUNTER — Other Ambulatory Visit: Payer: Self-pay | Admitting: Physician Assistant

## 2020-08-09 MED ORDER — ALENDRONATE SODIUM 70 MG PO TABS
70.0000 mg | ORAL_TABLET | ORAL | 3 refills | Status: DC
Start: 2020-08-09 — End: 2023-07-19

## 2020-08-09 MED ORDER — BACLOFEN 5 MG PO TABS: 1.0000 | ORAL_TABLET | Freq: Every evening | ORAL | 0 refills | Status: DC | PRN

## 2020-12-20 ENCOUNTER — Encounter: Payer: Self-pay | Admitting: Physician Assistant

## 2020-12-21 ENCOUNTER — Telehealth: Payer: Medicare Other | Admitting: Family Medicine

## 2020-12-21 NOTE — Progress Notes (Signed)
  On check in, realized [place]. Due to telemedicine laws unable to treat. Did let her know this, let her know of national telemedicine organizations or she could go to local [facility]. Also, sent message to PCP office to ensure she is not billed for a visit.

## 2021-04-07 ENCOUNTER — Encounter: Payer: Self-pay | Admitting: Physician Assistant

## 2021-05-29 ENCOUNTER — Encounter: Payer: Self-pay | Admitting: Physician Assistant

## 2021-05-29 ENCOUNTER — Other Ambulatory Visit: Payer: Self-pay | Admitting: Physician Assistant

## 2021-05-30 MED ORDER — BACLOFEN 5 MG PO TABS
1.0000 | ORAL_TABLET | Freq: Every evening | ORAL | 0 refills | Status: DC | PRN
Start: 2021-05-30 — End: 2021-06-22

## 2021-05-30 NOTE — Telephone Encounter (Signed)
Okay to refill Baclofen?

## 2021-06-07 ENCOUNTER — Ambulatory Visit (INDEPENDENT_AMBULATORY_CARE_PROVIDER_SITE_OTHER): Payer: Medicare Other

## 2021-06-07 ENCOUNTER — Other Ambulatory Visit: Payer: Self-pay

## 2021-06-07 DIAGNOSIS — Z1211 Encounter for screening for malignant neoplasm of colon: Secondary | ICD-10-CM

## 2021-06-07 DIAGNOSIS — Z0000 Encounter for general adult medical examination without abnormal findings: Secondary | ICD-10-CM

## 2021-06-07 NOTE — Progress Notes (Signed)
Virtual Visit via Telephone Note  I connected with  Tracey Moody on 06/07/21 at 10:15 AM EST by telephone and verified that I am speaking with the correct person using two identifiers.  Medicare Annual Wellness visit completed telephonically due to Covid-19 pandemic.   Persons participating in this call: This Health Coach and this patient.   Location: Patient: home Provider: office   I discussed the limitations, risks, security and privacy concerns of performing an evaluation and management service by telephone and the availability of in person appointments. The patient expressed understanding and agreed to proceed.  Unable to perform video visit due to video visit attempted and failed and/or patient does not have video capability.   Some vital signs may be absent or patient reported.   Marzella Schlein, LPN   Subjective:   Tracey Moody is a 65 y.o. female who presents for an Initial Medicare Annual Wellness Visit.  Review of Systems     Cardiac Risk Factors include: advanced age (>2men, >69 women)     Objective:    There were no vitals filed for this visit. There is no height or weight on file to calculate BMI.  Advanced Directives 06/07/2021  Does Patient Have a Medical Advance Directive? Yes  Does patient want to make changes to medical advance directive? Yes (MAU/Ambulatory/Procedural Areas - Information given)    Current Medications (verified) Outpatient Encounter Medications as of 06/07/2021  Medication Sig   alendronate (FOSAMAX) 70 MG tablet Take 1 tablet (70 mg total) by mouth every 7 (seven) days. Take with a full glass of water on an empty stomach.   aspirin EC 81 MG tablet Take 81 mg by mouth daily.   atorvastatin (LIPITOR) 40 MG tablet TAKE ONE TABLET BY MOUTH DAILY   b complex vitamins tablet Take 1 tablet by mouth daily.   Baclofen 5 MG TABS Take 1 tablet by mouth at bedtime as needed.   BIOTIN PO Take 1 tablet by mouth daily.   Calcium  Citrate (CITRACAL PO) Take 1,200 mg by mouth daily.    Cholecalciferol (VITAMIN D3) 2000 units TABS Take 1 tablet by mouth daily.   Echinacea 500 MG CAPS Take 2 capsules by mouth daily.   famotidine (PEPCID) 20 MG tablet TAKE 1 TABLET DAILY   naproxen sodium (ANAPROX) 220 MG tablet Take 220 mg by mouth as needed.   Omega-3 Fatty Acids (FISH OIL) 1000 MG CPDR Take 1 capsule by mouth daily.   Turmeric 500 MG CAPS Take 1 capsule by mouth daily.   [DISCONTINUED] albuterol (VENTOLIN HFA) 108 (90 Base) MCG/ACT inhaler Inhale 2 puffs into the lungs every 6 (six) hours as needed for wheezing or shortness of breath.   [DISCONTINUED] fluticasone (FLOVENT HFA) 44 MCG/ACT inhaler Inhale 1 puff into the lungs in the morning and at bedtime.   No facility-administered encounter medications on file as of 06/07/2021.    Allergies (verified) Patient has no known allergies.   History: History reviewed. No pertinent past medical history. Past Surgical History:  Procedure Laterality Date   APPENDECTOMY  77   CESAREAN SECTION  1979, 1980, 1983, 1984, 1987   TONSILLECTOMY  1960   Family History  Problem Relation Age of Onset   Lung cancer Mother    Osteoporosis Mother    Skin cancer Father    Hypertension Father    Hypercholesterolemia Sister    Hypercholesterolemia Brother    CVA Maternal Grandmother    Heart attack Sister    Hypercholesterolemia  Brother    Hypercholesterolemia Sister    Breast cancer Neg Hx    Social History   Socioeconomic History   Marital status: Married    Spouse name: Not on file   Number of children: Not on file   Years of education: Not on file   Highest education level: Not on file  Occupational History   Not on file  Tobacco Use   Smoking status: Never   Smokeless tobacco: Never  Vaping Use   Vaping Use: Never used  Substance and Sexual Activity   Alcohol use: Yes    Comment: occassionally beer   Drug use: No   Sexual activity: Yes    Birth  control/protection: Other-see comments    Comment: Husband Vasectomy  Other Topics Concern   Not on file  Social History Narrative   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   Moved from [facility] with her husband to be near her daughter and 4 kids   Husband does not work   International aid/development worker of Corporate investment banker Strain: Low Risk    Difficulty of Paying Living Expenses: Not hard at all  Food Insecurity: No Food Insecurity   Worried About Programme researcher, broadcasting/film/video in the Last Year: Never true   Barista in the Last Year: Never true  Transportation Needs: No Transportation Needs   Lack of Transportation (Medical): No   Lack of Transportation (Non-Medical): No  Physical Activity: Sufficiently Active   Days of Exercise per Week: 7 days   Minutes of Exercise per Session: 30 min  Stress: No Stress Concern Present   Feeling of Stress : Not at all  Social Connections: Moderately Integrated   Frequency of Communication with Friends and Family: More than three times a week   Frequency of Social Gatherings with Friends and Family: More than three times a week   Attends Religious Services: More than 4 times per year   Active Member of [employer] or Organizations: No   Attends Engineer, structural: Never   Marital Status: Married    Tobacco Counseling Counseling given: Not Answered   Clinical Intake:  Pre-visit preparation completed: Yes  Pain : No/denies pain     BMI - recorded: 28.63 Nutritional Status: BMI 25 -29 Overweight Nutritional Risks: None Diabetes: No  How often do you need to have someone help you when you read instructions, pamphlets, or other written materials from your doctor or pharmacy?: 1 - Never  Diabetic?No  Interpreter Needed?: No  Information entered by :: Hannah Strader, LPN   Activities of Daily Living In your present state of health, do you have any difficulty performing the following activities: 06/07/2021  Hearing? N  Vision? N  Difficulty  concentrating or making decisions? N  Walking or climbing stairs? N  Dressing or bathing? N  Doing errands, shopping? N  Preparing Food and eating ? N  Using the Toilet? N  In the past six months, have you accidently leaked urine? N  Do you have problems with loss of bowel control? N  Managing your Medications? N  Managing your Finances? N  Housekeeping or managing your Housekeeping? N  Some recent data might be hidden    Patient Care Team: Jarold Motto, [place] as PCP - General (Physician Assistant) Ob/Gyn, [facility] as Consulting Physician (Obstetrics and Gynecology)  Indicate any recent Medical Services you may have received from other than [facility] providers in the past year (date may be approximate).  Assessment:   This is a routine wellness examination for Mayleigh.  Hearing/Vision screen Hearing Screening - Comments:: Pt denies any hearing issues  Vision Screening - Comments:: Pt follows up with [facility] for annual eye exams   Dietary issues and exercise activities discussed: Current Exercise Habits: Home exercise routine, Type of exercise: walking, Time (Minutes): 30, Frequency (Times/Week): 7, Weekly Exercise (Minutes/Week): 210   Goals Addressed             This Visit's Progress    Patient Stated       Lose weight        Depression Screen PHQ 2/9 Scores 06/07/2021 06/01/2020 05/09/2019 12/13/2018 10/17/2017 10/17/2017 05/04/2017  PHQ - 2 Score 0 0 0 0 0 0 0  PHQ- 9 Score - 3 2 2 4  - 1  Exception Documentation - - - - - Medical reason -    Fall Risk Fall Risk  06/07/2021  Falls in the past year? 0  Number falls in past yr: 0  Injury with Fall? 0  Risk for fall due to : Impaired vision  Follow up Falls prevention discussed    FALL RISK PREVENTION PERTAINING TO THE HOME:  Any stairs in or around the home? No  If so, are there any without handrails? No  Home free of loose throw rugs in walkways, pet beds, electrical cords, etc? Yes  Adequate  lighting in your home to reduce risk of falls? Yes   ASSISTIVE DEVICES UTILIZED TO PREVENT FALLS:  Life alert? No  Use of a cane, walker or w/c? No  Grab bars in the bathroom? No  Shower chair or bench in shower? No  Elevated toilet seat or a handicapped toilet? No   TIMED UP AND GO:  Was the test performed? No .   Cognitive Function: MMSE - Mini Mental State Exam 06/01/2020  Orientation to time 5  Orientation to Place 5  Registration 3  Attention/ Calculation 5  Recall 3  Language- name 2 objects 2  Language- repeat 1  Language- follow 3 step command 3  Language- read & follow direction 1     6CIT Screen 06/07/2021  What Year? 0 points  What month? 0 points  What time? 0 points  Count back from 20 0 points  Months in reverse 0 points  Repeat phrase 0 points  Total Score 0    Immunizations Immunization History  Administered Date(s) Administered   Influenza, High Dose Seasonal PF 04/07/2021   Influenza,inj,Quad PF,6+ Mos 05/01/2017, 05/06/2018, 05/16/2020   Influenza-Unspecified 04/29/2019   PFIZER(Purple Top)SARS-COV-2 Vaccination 09/11/2019, 10/10/2019   Tdap 09/18/2013   Zoster Recombinat (Shingrix) 06/26/2018, 08/27/2018    TDAP status: Up to date  Flu Vaccine status: Up to date  Pneumococcal vaccine status: Due, Education has been provided regarding the importance of this vaccine. Advised may receive this vaccine at local pharmacy or Health Dept. Aware to provide a copy of the vaccination record if obtained from local pharmacy or Health Dept. Verbalized acceptance and understanding.  Covid-19 vaccine status: Completed vaccines  Qualifies for Shingles Vaccine? Yes   Zostavax completed Yes   Shingrix Completed?: Yes  Screening Tests Health Maintenance  Topic Date Due   COLONOSCOPY (Pts 45-34yrs Insurance coverage will need to be confirmed)  09/22/2013   COVID-19 Vaccine (3 - Booster for Pfizer series) 12/05/2019   Pneumonia Vaccine 96+ Years old (1 -  PCV) Never done   MAMMOGRAM  05/31/2021   PAP SMEAR-Modifier  04/29/2021  DEXA SCAN  05/31/2022   TETANUS/TDAP  09/19/2023   INFLUENZA VACCINE  Completed   Hepatitis C Screening  Completed   HIV Screening  Completed   Zoster Vaccines- Shingrix  Completed   HPV VACCINES  Aged Out    Health Maintenance  Health Maintenance Due  Topic Date Due   COLONOSCOPY (Pts 45-73yrs Insurance coverage will need to be confirmed)  09/22/2013   COVID-19 Vaccine (3 - Booster for Pfizer series) 12/05/2019   Pneumonia Vaccine 78+ Years old (1 - PCV) Never done   MAMMOGRAM  05/31/2021   PAP SMEAR-Modifier  04/29/2021    Colorectal cancer screening: Referral to GI placed 06/07/21. Pt aware the office will call re: appt.  Mammogram Status : Appt scheduled 07/06/21  Bone Density status: Completed 05/31/20. Results reflect: Bone density results: OSTEOPOROSIS. Repeat every 2-3 years.    Additional Screening:  Hepatitis C Screening:  Completed 03/01/17  Vision Screening: Recommended annual ophthalmology exams for early detection of glaucoma and other disorders of the eye. Is the patient up to date with their annual eye exam?  Yes  Who is the provider or what is the name of the office in which the patient attends annual eye exams? [facility]  If pt is not established with a provider, would they like to be referred to a provider to establish care? No .   Dental Screening: Recommended annual dental exams for proper oral hygiene  Community Resource Referral / Chronic Care Management: CRR required this visit?  No   CCM required this visit?  No      Plan:     I have personally reviewed and noted the following in the patient's chart:   Medical and social history Use of alcohol, tobacco or illicit drugs  Current medications and supplements including opioid prescriptions. Patient is not currently taking opioid prescriptions. Functional ability and status Nutritional status Physical activity Advanced  directives List of other physicians Hospitalizations, surgeries, and ER visits in previous 12 months Vitals Screenings to include cognitive, depression, and falls Referrals and appointments  In addition, I have reviewed and discussed with patient certain preventive protocols, quality metrics, and best practice recommendations. A written personalized care plan for preventive services as well as general preventive health recommendations were provided to patient.     Marzella Schlein, LPN   45/12/2561   Nurse Notes: None

## 2021-06-07 NOTE — Patient Instructions (Signed)
Tracey Moody , Thank you for taking time to come for your Medicare Wellness Visit. I appreciate your ongoing commitment to your health goals. Please review the following plan we discussed and let me know if I can assist you in the future.   Screening recommendations/referrals: Colonoscopy: order placed 06/07/21 Mammogram: Scheduled 07/06/21 Bone Density: Done 05/31/20 repeat every 2 years  Recommended yearly ophthalmology/optometry visit for glaucoma screening and checkup Recommended yearly dental visit for hygiene and checkup  Vaccinations: Influenza vaccine: Done 04/07/21 Pneumococcal vaccine: Due and discussed Tdap vaccine: Done 09/18/13 repeat every 10 years  Shingles vaccine: Completed 06/26/18 & 08/27/18   Covid-19:Completed 2/11 & 10/10/19  Advanced directives: Please bring a copy of your health care power of attorney and living will to the office at your convenience.  Conditions/risks identified: lose weight   Next appointment: Follow up in one year for your annual wellness visit    Preventive Care 65 Years and Older, Female Preventive care refers to lifestyle choices and visits with your health care provider that can promote health and wellness. What does preventive care include? A yearly physical exam. This is also called an annual well check. Dental exams once or twice a year. Routine eye exams. Ask your health care provider how often you should have your eyes checked. Personal lifestyle choices, including: Daily care of your teeth and gums. Regular physical activity. Eating a healthy diet. Avoiding tobacco and drug use. Limiting alcohol use. Practicing safe sex. Taking low-dose aspirin every day. Taking vitamin and mineral supplements as recommended by your health care provider. What happens during an annual well check? The services and screenings done by your health care provider during your annual well check will depend on your age, overall health, lifestyle risk  factors, and family history of disease. Counseling  Your health care provider may ask you questions about your: Alcohol use. Tobacco use. Drug use. Emotional well-being. Home and relationship well-being. Sexual activity. Eating habits. History of falls. Memory and ability to understand (cognition). Work and work Astronomer. Reproductive health. Screening  You may have the following tests or measurements: Height, weight, and BMI. Blood pressure. Lipid and cholesterol levels. These may be checked every 5 years, or more frequently if you are over 12 years old. Skin check. Lung cancer screening. You may have this screening every year starting at age 63 if you have a 30-pack-year history of smoking and currently smoke or have quit within the past 15 years. Fecal occult blood test (FOBT) of the stool. You may have this test every year starting at age 52. Flexible sigmoidoscopy or colonoscopy. You may have a sigmoidoscopy every 5 years or a colonoscopy every 10 years starting at age 92. Hepatitis C blood test. Hepatitis B blood test. Sexually transmitted disease (STD) testing. Diabetes screening. This is done by checking your blood sugar (glucose) after you have not eaten for a while (fasting). You may have this done every 1-3 years. Bone density scan. This is done to screen for osteoporosis. You may have this done starting at age 73. Mammogram. This may be done every 1-2 years. Talk to your health care provider about how often you should have regular mammograms. Talk with your health care provider about your test results, treatment options, and if necessary, the need for more tests. Vaccines  Your health care provider may recommend certain vaccines, such as: Influenza vaccine. This is recommended every year. Tetanus, diphtheria, and acellular pertussis (Tdap, Td) vaccine. You may need a Td booster every 10  years. Zoster vaccine. You may need this after age 4. Pneumococcal 13-valent  conjugate (PCV13) vaccine. One dose is recommended after age 64. Pneumococcal polysaccharide (PPSV23) vaccine. One dose is recommended after age 80. Talk to your health care provider about which screenings and vaccines you need and how often you need them. This information is not intended to replace advice given to you by your health care provider. Make sure you discuss any questions you have with your health care provider. Document Released: 08/13/2015 Document Revised: 04/05/2016 Document Reviewed: 05/18/2015 Elsevier Interactive Patient Education  2017 [place] Prevention in the Home Falls can cause injuries. They can happen to people of all ages. There are many things you can do to make your home safe and to help prevent falls. What can I do on the outside of my home? Regularly fix the edges of walkways and driveways and fix any cracks. Remove anything that might make you trip as you walk through a door, such as a raised step or threshold. Trim any bushes or trees on the path to your home. Use bright outdoor lighting. Clear any walking paths of anything that might make someone trip, such as rocks or tools. Regularly check to see if handrails are loose or broken. Make sure that both sides of any steps have handrails. Any raised decks and porches should have guardrails on the edges. Have any leaves, snow, or ice cleared regularly. Use sand or salt on walking paths during winter. Clean up any spills in your garage right away. This includes oil or grease spills. What can I do in the bathroom? Use night lights. Install grab bars by the toilet and in the tub and shower. Do not use towel bars as grab bars. Use non-skid mats or decals in the tub or shower. If you need to sit down in the shower, use a plastic, non-slip stool. Keep the floor dry. Clean up any water that spills on the floor as soon as it happens. Remove soap buildup in the tub or shower regularly. Attach bath mats  securely with double-sided non-slip rug tape. Do not have throw rugs and other things on the floor that can make you trip. What can I do in the bedroom? Use night lights. Make sure that you have a light by your bed that is easy to reach. Do not use any sheets or blankets that are too big for your bed. They should not hang down onto the floor. Have a firm chair that has side arms. You can use this for support while you get dressed. Do not have throw rugs and other things on the floor that can make you trip. What can I do in the kitchen? Clean up any spills right away. Avoid walking on wet floors. Keep items that you use a lot in easy-to-reach places. If you need to reach something above you, use a strong step stool that has a grab bar. Keep electrical cords out of the way. Do not use floor polish or wax that makes floors slippery. If you must use wax, use non-skid floor wax. Do not have throw rugs and other things on the floor that can make you trip. What can I do with my stairs? Do not leave any items on the stairs. Make sure that there are handrails on both sides of the stairs and use them. Fix handrails that are broken or loose. Make sure that handrails are as long as the stairways. Check any carpeting to make sure  that it is firmly attached to the stairs. Fix any carpet that is loose or worn. Avoid having throw rugs at the top or bottom of the stairs. If you do have throw rugs, attach them to the floor with carpet tape. Make sure that you have a light switch at the top of the stairs and the bottom of the stairs. If you do not have them, ask someone to add them for you. What else can I do to help prevent falls? Wear shoes that: Do not have high heels. Have rubber bottoms. Are comfortable and fit you well. Are closed at the toe. Do not wear sandals. If you use a stepladder: Make sure that it is fully opened. Do not climb a closed stepladder. Make sure that both sides of the stepladder  are locked into place. Ask someone to hold it for you, if possible. Clearly mark and make sure that you can see: Any grab bars or handrails. First and last steps. Where the edge of each step is. Use tools that help you move around (mobility aids) if they are needed. These include: Canes. Walkers. Scooters. Crutches. Turn on the lights when you go into a dark area. Replace any light bulbs as soon as they burn out. Set up your furniture so you have a clear path. Avoid moving your furniture around. If any of your floors are uneven, fix them. If there are any pets around you, be aware of where they are. Review your medicines with your doctor. Some medicines can make you feel dizzy. This can increase your chance of falling. Ask your doctor what other things that you can do to help prevent falls. This information is not intended to replace advice given to you by your health care provider. Make sure you discuss any questions you have with your health care provider. Document Released: 05/13/2009 Document Revised: 12/23/2015 Document Reviewed: 08/21/2014 Elsevier Interactive Patient Education  2017 [employer].

## 2021-06-22 ENCOUNTER — Other Ambulatory Visit: Payer: Self-pay | Admitting: Physician Assistant

## 2021-07-06 LAB — HM MAMMOGRAPHY

## 2021-07-11 ENCOUNTER — Other Ambulatory Visit: Payer: Self-pay | Admitting: Physician Assistant

## 2021-07-12 NOTE — Progress Notes (Signed)
Subjective:    Tracey Moody is a 65 y.o. female and is here for a comprehensive physical exam.  HPI  Health Maintenance Due  Topic Date Due   COLONOSCOPY (Pts 45-63yrs Insurance coverage will need to be confirmed)  09/22/2013   COVID-19 Vaccine (3 - Booster for Pfizer series) 12/05/2019   Pneumonia Vaccine 87+ Years old (1 - PCV) Never done   MAMMOGRAM  05/31/2021    Acute Concerns: Overweight Tracey Moody expresses she can not seem to lose weight despite regularly exercising and watching what she eats. States she feels as though, she is not eating as much and believes that could be a cause to lack of weight loss. Although she is having trouble losing weight, she has not gained any either. She does admit that despite exercising often, she does not participate in strength workouts.  Insomnia Pt has trouble falling asleep at night due to constant brain activity for the last couple of weeks. She has tried reading, no taking naps during the day, and completing puzzles to improve her sx but nothing has helped. She has also tried melatonin and other OTC sleep medications but has found she is still waking up in the middle of the night. At this time she is interested in trialing a medication despite not usually wanting to take medication.   Shoulder Pain Patient says she has been experiencing shoulder and neck pain that travels down her arms. States she noticed this would happen more at night which led to her to believe that her sleeping on her shoulder nightly could be the cause. She did speak with her daughter about this issue, who is a PT, and was given some exercises to do. [name] says these exercises have provided her with major relief and is managing well.   Memory Changes Due to noticing signs of dementia in her husband and her own issues with short term memory, Tracey Moody would like to receive information on where they can be evaluated for dementia or alzheimer's  disease.  Chronic Issues: HLD Tracey Moody is compliant with taking lipitor 40 mg daily and asprin 81 mg daily with no adverse effects. She is managing well at this time.   Heartburn Reports she is still taking pepcid 20 mg daily and hasn't had any heartburn recently. She is managing well.   Osteoporosis  Pt is compliant with fosamax 70 mg daily with no adverse effects. She is UTD with her DEXA scan and is tolerating well at this time.   Health Maintenance: Immunizations -- Covid- UTD; no booster Influenza- UTD Tdap- UTD;2015 Colonoscopy -- Due; 2015 Mammogram -- UTD reported per patient PAP -- Due  Bone Density -- FYB;0175 Ophthalmology- UTD Dentistry- Appt in January Diet -- Eats all food groups; has decreased sugar intake Sleep habits -- Currently experiencing insomnia Exercise -- Walks 1-3 miles daily Weight -- Stable Mood -- Stable Weight history: Wt Readings from Last 10 Encounters:  07/13/21 155 lb 4 oz (70.4 kg)  08/03/20 154 lb (69.9 kg)  06/01/20 154 lb (69.9 kg)  05/09/19 154 lb 4 oz (70 kg)  10/23/18 149 lb (67.6 kg)  06/26/18 155 lb (70.3 kg)  06/03/18 153 lb 4 oz (69.5 kg)  10/17/17 154 lb 12.8 oz (70.2 kg)  05/04/17 151 lb 6.4 oz (68.7 kg)  03/01/17 141 lb 4 oz (64.1 kg)   Body mass index is 28.86 kg/m. No LMP recorded. Patient is postmenopausal. Alcohol use:  reports current alcohol use. Tobacco use:  Tobacco  Use: Low Risk    Smoking Tobacco Use: Never   Smokeless Tobacco Use: Never   Passive Exposure: Not on file     Depression screen [facility] 2/9 07/13/2021  Decreased Interest 0  Down, Depressed, Hopeless 0  PHQ - 2 Score 0  Altered sleeping -  Tired, decreased energy -  Change in appetite -  Feeling bad or failure about yourself  -  Trouble concentrating -  Moving slowly or fidgety/restless -  Suicidal thoughts -  PHQ-9 Score -  Difficult doing work/chores -     Other providers/specialists: Patient Care Team: Jarold Motto, [place] as PCP -  General (Physician Assistant) Ob/Gyn, [facility] as Consulting Physician (Obstetrics and Gynecology)    PMHx, SurgHx, SocialHx, Medications, and Allergies were reviewed in the Visit Navigator and updated as appropriate.   History reviewed. No pertinent past medical history.   Past Surgical History:  Procedure Laterality Date   APPENDECTOMY  36   CESAREAN SECTION  1979, 1980, 1983, 1984, 1987   TONSILLECTOMY  1960     Family History  Problem Relation Age of Onset   Lung cancer Mother    Osteoporosis Mother    Skin cancer Father    Hypertension Father    Hypercholesterolemia Sister    Hypercholesterolemia Brother    CVA Maternal Grandmother    Heart attack Sister    Hypercholesterolemia Brother    Hypercholesterolemia Sister    Breast cancer Neg Hx     Social History   Tobacco Use   Smoking status: Never   Smokeless tobacco: Never  Vaping Use   Vaping Use: Never used  Substance Use Topics   Alcohol use: Yes    Comment: occassionally beer   Drug use: No    Review of Systems:   Review of Systems  Constitutional:  Negative for chills, fever, malaise/fatigue and weight loss.  HENT:  Negative for hearing loss, sinus pain and sore throat.   Respiratory:  Negative for cough and hemoptysis.   Cardiovascular:  Negative for chest pain, palpitations, leg swelling and PND.  Gastrointestinal:  Negative for abdominal pain, constipation, diarrhea, heartburn, nausea and vomiting.  Genitourinary:  Negative for dysuria, frequency and urgency.  Musculoskeletal:  Negative for back pain, myalgias and neck pain.  Skin:  Negative for itching and rash.  Neurological:  Negative for dizziness, tingling, seizures and headaches.  Endo/Heme/Allergies:  Negative for polydipsia.  Psychiatric/Behavioral:  Negative for depression. The patient is not nervous/anxious.    Objective:   BP 112/80 (BP Location: Left Arm, Patient Position: Sitting, Cuff Size: Normal)    Pulse 68    Temp  98.2 F (36.8 C) (Temporal)    Ht 5' 1.5" (1.562 m)    Wt 155 lb 4 oz (70.4 kg)    SpO2 100%    BMI 28.86 kg/m  Body mass index is 28.86 kg/m.   General Appearance:    Alert, cooperative, no distress, appears stated age  Head:    Normocephalic, without obvious abnormality, atraumatic  Eyes:    PERRL, conjunctiva/corneas clear, EOM's intact, fundi    benign, both eyes  Ears:    Normal TM's and external ear canals, both ears  Nose:   Nares normal, septum midline, mucosa normal, no drainage    or sinus tenderness  Throat:   Lips, mucosa, and tongue normal; teeth and gums normal  Neck:   Supple, symmetrical, trachea midline, no adenopathy;    thyroid:  no enlargement/tenderness/nodules; no carotid   bruit or  JVD  Back:     Symmetric, no curvature, ROM normal, no CVA tenderness  Lungs:     Clear to auscultation bilaterally, respirations unlabored  Chest Wall:    No tenderness or deformity   Heart:    Regular rate and rhythm, S1 and S2 normal, no murmur, rub or gallop  Breast Exam:    Deferred  Abdomen:     Soft, non-tender, bowel sounds active all four quadrants,    no masses, no organomegaly  Genitalia:    Deferred  Extremities:   Extremities normal, atraumatic, no cyanosis or edema  Pulses:   2+ and symmetric all extremities  Skin:   Skin color, texture, turgor normal, no rashes or lesions  Lymph nodes:   Cervical, supraclavicular, and axillary nodes normal  Neurologic:   CNII-XII intact, normal strength, sensation and reflexes    throughout    Assessment/Plan:   Routine Physical Examination with Abnormal Findings Today patient counseled on age appropriate routine health concerns for screening and prevention, each reviewed and up to date or declined. Immunizations reviewed and up to date or declined. Labs ordered and reviewed. Risk factors for depression reviewed and negative. Hearing function and visual acuity are intact. ADLs screened and addressed as needed. Functional ability and  level of safety reviewed and appropriate. Education, counseling and referrals performed based on assessed risks today. Patient provided with a copy of personalized plan for preventive services.  Hyperlipidemia, unspecified hyperlipidemia type Continue Lipitor 40 mg daily Will update labs today, adjust medication as indicated  - Lipid Profile  Localized osteoporosis without current pathological fracture Continue Fosamax 70 mg daily  Will update DEXA next year  Colon cancer screening Will place o tinue pepcid 20 mg daily  Encouraged patient to maintain a well balanced diet  Insomnia, unspecified type Trial Trazodone 25-50 mg daily as needed  Follow up if symptoms worsen or concerns occur  Memory changes Referral placed for Neurocognitive testing  Follow up based on results  R shoulder pain Denies any acute needs Continue to monitor -- consider referral to sports medicine for further evaluation/mgmt  Patient Counseling: [x]    Nutrition: Stressed importance of moderation in sodium/caffeine intake, saturated fat and cholesterol, caloric balance, sufficient intake of fresh fruits, vegetables, fiber, calcium, iron, and 1 mg of folate supplement per day (for females capable of pregnancy).     Stressed the importance of regular exercise.      Substance Abuse: Discussed cessation/primary prevention of tobacco, alcohol, or other drug use; driving or other dangerous activities under the influence; availability of treatment for abuse.      Injury prevention: Discussed safety belts, safety helmets, smoke detector, smoking near bedding or upholstery.      Sexuality: Discussed sexually transmitted diseases, partner selection, use of condoms, avoidance of unintended pregnancy  and contraceptive alternatives.     Dental health: Discussed importance of regular tooth brushing, flossing,  and dental visits.     Health maintenance and immunizations reviewed. Please refer to Health maintenance section.   I,Havlyn C Ratchford,acting as a Neurosurgeon for [employer], PA.,have documented all relevant documentation on the behalf of Jarold Motto, PA,as directed by  Jarold Motto, PA while in the presence of Jarold Motto, [place].  I, Jarold Motto, [place], have reviewed all documentation for this visit. The documentation on 07/13/21 for the exam, diagnosis, procedures, and orders are all accurate and complete.   Jarold Motto, PA-C [place]

## 2021-07-13 ENCOUNTER — Ambulatory Visit (INDEPENDENT_AMBULATORY_CARE_PROVIDER_SITE_OTHER): Payer: Medicare Other | Admitting: Physician Assistant

## 2021-07-13 ENCOUNTER — Other Ambulatory Visit: Payer: Self-pay

## 2021-07-13 ENCOUNTER — Encounter: Payer: Self-pay | Admitting: Physician Assistant

## 2021-07-13 VITALS — BP 112/80 | HR 68 | Temp 98.2°F | Ht 61.5 in | Wt 155.2 lb

## 2021-07-13 DIAGNOSIS — Z0001 Encounter for general adult medical examination with abnormal findings: Secondary | ICD-10-CM | POA: Diagnosis not present

## 2021-07-13 DIAGNOSIS — G4700 Insomnia, unspecified: Secondary | ICD-10-CM | POA: Diagnosis not present

## 2021-07-13 DIAGNOSIS — M816 Localized osteoporosis [Lequesne]: Secondary | ICD-10-CM

## 2021-07-13 DIAGNOSIS — E785 Hyperlipidemia, unspecified: Secondary | ICD-10-CM

## 2021-07-13 DIAGNOSIS — Z23 Encounter for immunization: Secondary | ICD-10-CM | POA: Diagnosis not present

## 2021-07-13 DIAGNOSIS — M25511 Pain in right shoulder: Secondary | ICD-10-CM | POA: Diagnosis not present

## 2021-07-13 DIAGNOSIS — Z1211 Encounter for screening for malignant neoplasm of colon: Secondary | ICD-10-CM

## 2021-07-13 DIAGNOSIS — E663 Overweight: Secondary | ICD-10-CM | POA: Diagnosis not present

## 2021-07-13 DIAGNOSIS — R413 Other amnesia: Secondary | ICD-10-CM

## 2021-07-13 DIAGNOSIS — K219 Gastro-esophageal reflux disease without esophagitis: Secondary | ICD-10-CM

## 2021-07-13 LAB — COMPREHENSIVE METABOLIC PANEL
ALT: 15 U/L (ref 0–35)
AST: 18 U/L (ref 0–37)
Albumin: 4.6 g/dL (ref 3.5–5.2)
Alkaline Phosphatase: 57 U/L (ref 39–117)
BUN: 15 mg/dL (ref 6–23)
CO2: 29 mEq/L (ref 19–32)
Calcium: 10 mg/dL (ref 8.4–10.5)
Chloride: 103 mEq/L (ref 96–112)
Creatinine, Ser: 0.64 mg/dL (ref 0.40–1.20)
GFR: 92.32 mL/min (ref >60.00)
Glucose, Bld: 87 mg/dL (ref 70–99)
Potassium: 5.4 mEq/L — ABNORMAL HIGH (ref 3.5–5.1)
Sodium: 140 mEq/L (ref 135–145)
Total Bilirubin: 0.8 mg/dL (ref 0.2–1.2)
Total Protein: 7.5 g/dL (ref 6.0–8.3)

## 2021-07-13 LAB — CBC WITH DIFFERENTIAL/PLATELET
Basophils Absolute: 0.1 10*3/uL (ref 0.0–0.1)
Basophils Relative: 1 % (ref 0.0–3.0)
Eosinophils Absolute: 0.3 10*3/uL (ref 0.0–0.7)
Eosinophils Relative: 4.4 % (ref 0.0–5.0)
HCT: 41.8 % (ref 36.0–46.0)
Hemoglobin: 13.9 g/dL (ref 12.0–15.0)
Lymphocytes Relative: 39.7 % (ref 12.0–46.0)
Lymphs Abs: 2.3 10*3/uL (ref 0.7–4.0)
MCHC: 33.3 g/dL (ref 30.0–36.0)
MCV: 94.4 fl (ref 78.0–100.0)
Monocytes Absolute: 0.3 10*3/uL (ref 0.1–1.0)
Monocytes Relative: 5.3 % (ref 3.0–12.0)
Neutro Abs: 2.9 10*3/uL (ref 1.4–7.7)
Neutrophils Relative %: 49.6 % (ref 43.0–77.0)
Platelets: 229 10*3/uL (ref 150.0–400.0)
RBC: 4.43 Mil/uL (ref 3.87–5.11)
RDW: 13.4 % (ref 11.5–15.5)
WBC: 5.8 10*3/uL (ref 4.0–10.5)

## 2021-07-13 LAB — LIPID PANEL
Cholesterol: 222 mg/dL — ABNORMAL HIGH (ref 0–200)
HDL: 86.9 mg/dL (ref >39.00)
LDL Cholesterol: 118 mg/dL — ABNORMAL HIGH (ref 0–99)
NonHDL: 134.75
Total CHOL/HDL Ratio: 3
Triglycerides: 84 mg/dL (ref 0.0–149.0)
VLDL: 16.8 mg/dL (ref 0.0–40.0)

## 2021-07-13 MED ORDER — TRAZODONE HCL 50 MG PO TABS: 25.0000 mg | ORAL_TABLET | Freq: Every evening | ORAL | 3 refills | Status: DC | PRN

## 2021-07-13 NOTE — Patient Instructions (Addendum)
It was great to see you!  Work on Medical laboratory scientific officer by doing Engineer, technical sales  Trial trazodone for sleep  Referral placed for NeuroCognitive Testing and Colonoscopy  Please go to the lab for blood work.   Our office will call you with your results unless you have chosen to receive results via MyChart.  If your blood work is normal we will follow-up each year for physicals and as scheduled for chronic medical problems.  If anything is abnormal we will treat accordingly and get you in for a follow-up.  Take care,  Lelon Mast

## 2021-07-13 NOTE — Addendum Note (Signed)
Addended by: Jimmye Norman on: 07/13/2021 01:44 PM   Modules accepted: Orders

## 2021-07-14 ENCOUNTER — Other Ambulatory Visit: Payer: Self-pay | Admitting: Physician Assistant

## 2021-07-14 ENCOUNTER — Encounter: Payer: Self-pay | Admitting: Physician Assistant

## 2021-07-14 ENCOUNTER — Encounter: Payer: Self-pay | Admitting: Psychology

## 2021-07-14 DIAGNOSIS — R413 Other amnesia: Secondary | ICD-10-CM

## 2021-07-14 DIAGNOSIS — E875 Hyperkalemia: Secondary | ICD-10-CM

## 2021-07-15 ENCOUNTER — Other Ambulatory Visit (INDEPENDENT_AMBULATORY_CARE_PROVIDER_SITE_OTHER): Payer: Medicare Other

## 2021-07-15 ENCOUNTER — Other Ambulatory Visit: Payer: Self-pay

## 2021-07-15 DIAGNOSIS — E875 Hyperkalemia: Secondary | ICD-10-CM

## 2021-07-15 LAB — BASIC METABOLIC PANEL
BUN: 23 mg/dL (ref 6–23)
CO2: 28 mEq/L (ref 19–32)
Calcium: 9.8 mg/dL (ref 8.4–10.5)
Chloride: 104 mEq/L (ref 96–112)
Creatinine, Ser: 0.65 mg/dL (ref 0.40–1.20)
GFR: 91.97 mL/min (ref >60.00)
Glucose, Bld: 74 mg/dL (ref 70–99)
Potassium: 4.8 mEq/L (ref 3.5–5.1)
Sodium: 140 mEq/L (ref 135–145)

## 2021-07-20 ENCOUNTER — Other Ambulatory Visit: Payer: Self-pay | Admitting: Physician Assistant

## 2021-08-06 ENCOUNTER — Other Ambulatory Visit: Payer: Self-pay | Admitting: Physician Assistant

## 2021-08-12 ENCOUNTER — Other Ambulatory Visit: Payer: Self-pay | Admitting: Physician Assistant

## 2021-09-14 ENCOUNTER — Other Ambulatory Visit: Payer: Self-pay | Admitting: Physician Assistant

## 2021-10-13 ENCOUNTER — Other Ambulatory Visit: Payer: Self-pay | Admitting: Physician Assistant

## 2021-10-24 ENCOUNTER — Encounter: Payer: Self-pay | Admitting: Internal Medicine

## 2021-11-04 ENCOUNTER — Other Ambulatory Visit: Payer: Self-pay | Admitting: Physician Assistant

## 2021-11-07 ENCOUNTER — Encounter: Payer: Self-pay | Admitting: Physician Assistant

## 2021-12-06 ENCOUNTER — Other Ambulatory Visit: Payer: Self-pay | Admitting: Physician Assistant

## 2021-12-13 ENCOUNTER — Ambulatory Visit (AMBULATORY_SURGERY_CENTER): Payer: Medicare Other | Admitting: *Deleted

## 2021-12-13 VITALS — Ht 61.5 in | Wt 147.0 lb

## 2021-12-13 DIAGNOSIS — Z86010 Personal history of colonic polyps: Secondary | ICD-10-CM

## 2021-12-13 NOTE — Progress Notes (Signed)
No egg or soy allergy known to patient  No issues known to pt with past sedation with any surgeries or procedures Patient denies ever being told they had issues or difficulty with intubation  No FH of Malignant Hyperthermia Pt is not on diet pills Pt is not on  home 02  Pt is not on blood thinners  Pt denies issues with constipation  No A fib or A flutter   PV completed over the phone. Pt verified name, DOB, address and insurance during PV today.    Pt encouraged to call with questions or issues.  If pt has My chart, procedure instructions sent via My Chart  Insurance confirmed with pt at PV today   

## 2021-12-20 ENCOUNTER — Other Ambulatory Visit: Payer: Self-pay | Admitting: Physician Assistant

## 2021-12-27 ENCOUNTER — Encounter: Payer: Medicare Other | Admitting: Internal Medicine

## 2022-02-21 ENCOUNTER — Encounter: Payer: Medicare Other | Admitting: Psychology

## 2022-02-28 ENCOUNTER — Encounter: Payer: Medicare Other | Admitting: Psychology

## 2022-04-24 ENCOUNTER — Encounter: Payer: Self-pay | Admitting: *Deleted

## 2022-06-20 ENCOUNTER — Ambulatory Visit (INDEPENDENT_AMBULATORY_CARE_PROVIDER_SITE_OTHER): Payer: Medicare Other

## 2022-06-20 VITALS — Wt 147.0 lb

## 2022-06-20 DIAGNOSIS — Z1211 Encounter for screening for malignant neoplasm of colon: Secondary | ICD-10-CM | POA: Diagnosis not present

## 2022-06-20 DIAGNOSIS — Z0000 Encounter for general adult medical examination without abnormal findings: Secondary | ICD-10-CM

## 2022-06-20 NOTE — Progress Notes (Signed)
I connected with  Yaniris Braddock Chiappetta on 06/20/22 by a audio enabled telemedicine application and verified that I am speaking with the correct person using two identifiers.  Patient Location: Home  Provider Location: Office/Clinic  I discussed the limitations of evaluation and management by telemedicine. The patient expressed understanding and agreed to proceed.   Subjective:   Tracey Moody is a 66 y.o. female who presents for Medicare Annual (Subsequent) preventive examination.  Review of Systems     Cardiac Risk Factors include: advanced age (>42men, >74 women);dyslipidemia     Objective:    Today's Vitals   06/20/22 1011  Weight: 147 lb (66.7 kg)   Body mass index is 27.33 kg/m.     06/20/2022   10:16 AM 06/07/2021   10:12 AM  Advanced Directives  Does Patient Have a Medical Advance Directive? Yes Yes  Type of Estate agent of [place];Living will   Does patient want to make changes to medical advance directive?  Yes (MAU/Ambulatory/Procedural Areas - Information given)  Copy of Healthcare Power of Attorney in Chart? No - copy requested     Current Medications (verified) Outpatient Encounter Medications as of 06/20/2022  Medication Sig   alendronate (FOSAMAX) 70 MG tablet Take 1 tablet (70 mg total) by mouth every 7 (seven) days. Take with a full glass of water on an empty stomach.   aspirin EC 81 MG tablet Take 81 mg by mouth daily.   atorvastatin (LIPITOR) 40 MG tablet TAKE 1 TABLET BY MOUTH EVERY DAY   b complex vitamins tablet Take 1 tablet by mouth daily.   Baclofen 5 MG TABS TAKE 1 TABLET BY MOUTH EVERY DAY AT BEDTIME AS NEEDED   BIOTIN PO Take 1 tablet by mouth daily.   Calcium Citrate (CITRACAL PO) Take 1,200 mg by mouth daily.    Cholecalciferol (VITAMIN D3) 2000 units TABS Take 1 tablet by mouth daily.   Echinacea 500 MG CAPS Take 2 capsules by mouth daily.   famotidine (PEPCID) 20 MG tablet TAKE 1 TABLET DAILY   naproxen  sodium (ANAPROX) 220 MG tablet Take 220 mg by mouth as needed.   Omega-3 Fatty Acids (FISH OIL) 1000 MG CPDR Take 1 capsule by mouth daily.   Turmeric 500 MG CAPS Take 1 capsule by mouth daily.   [DISCONTINUED] traZODone (DESYREL) 50 MG tablet TAKE 1/2-1 TABLETS BY MOUTH AT BEDTIME AS NEEDED FOR SLEEP. (Patient not taking: Reported on 12/13/2021)   No facility-administered encounter medications on file as of 06/20/2022.    Allergies (verified) Patient has no known allergies.   History: Past Medical History:  Diagnosis Date   Hyperlipidemia    Past Surgical History:  Procedure Laterality Date   APPENDECTOMY  45   CESAREAN SECTION  1979, 59, 47, 1984, 1987   TONSILLECTOMY  1960   Family History  Problem Relation Age of Onset   Lung cancer Mother    Osteoporosis Mother    Skin cancer Father    Hypertension Father    Hypercholesterolemia Sister    Heart attack Sister    Hypercholesterolemia Sister    Hypercholesterolemia Brother    Hypercholesterolemia Brother    CVA Maternal Grandmother    Alzheimer's disease Maternal Grandmother    Breast cancer Neg Hx    Colon cancer Neg Hx    Colon polyps Neg Hx    Esophageal cancer Neg Hx    Stomach cancer Neg Hx    Rectal cancer Neg Hx    Social  History   Socioeconomic History   Marital status: Married    Spouse name: Not on file   Number of children: Not on file   Years of education: Not on file   Highest education level: Not on file  Occupational History   Not on file  Tobacco Use   Smoking status: Never   Smokeless tobacco: Never  Vaping Use   Vaping Use: Never used  Substance and Sexual Activity   Alcohol use: Yes    Comment: occassionally beer   Drug use: No   Sexual activity: Yes    Birth control/protection: Other-see comments    Comment: Husband Vasectomy  Other Topics Concern   Not on file  Social History Narrative   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   Moved from [facility] with her husband to be near her daughter and  4 kids   Husband does not work   Chemical engineer Strain: Low Risk  (06/20/2022)   Overall Financial Resource Strain (CARDIA)    Difficulty of Paying Living Expenses: Not hard at all  Food Insecurity: No Food Insecurity (06/20/2022)   Hunger Vital Sign    Worried About Running Out of Food in the Last Year: Never true    Ran Out of Food in the Last Year: Never true  Transportation Needs: No Transportation Needs (06/20/2022)   PRAPARE - Administrator, Civil Service (Medical): No    Lack of Transportation (Non-Medical): No  Physical Activity: Sufficiently Active (06/20/2022)   Exercise Vital Sign    Days of Exercise per Week: 7 days    Minutes of Exercise per Session: 30 min  Stress: No Stress Concern Present (06/20/2022)   [employer] of Occupational Health - Occupational Stress Questionnaire    Feeling of Stress : Not at all  Social Connections: Moderately Integrated (06/20/2022)   Social Connection and Isolation Panel [NHANES]    Frequency of Communication with Friends and Family: More than three times a week    Frequency of Social Gatherings with Friends and Family: More than three times a week    Attends Religious Services: More than 4 times per year    Active Member of [employer] or Organizations: No    Attends Engineer, structural: Never    Marital Status: Married    Tobacco Counseling Counseling given: Not Answered   Clinical Intake:  Pre-visit preparation completed: Yes  Pain : No/denies pain     BMI - recorded: 27.33 Nutritional Status: BMI 25 -29 Overweight Nutritional Risks: None Diabetes: No  How often do you need to have someone help you when you read instructions, pamphlets, or other written materials from your doctor or pharmacy?: 1 - Never  Diabetic?no  Interpreter Needed?: No  Information entered by :: Lanier Ensign, LPN   Activities of Daily Living    06/20/2022   10:17 AM 06/16/2022     2:30 PM  In your present state of health, do you have any difficulty performing the following activities:  Hearing? 0 0  Vision? 0 0  Difficulty concentrating or making decisions? 0 0  Walking or climbing stairs? 0 0  Dressing or bathing? 0 0  Doing errands, shopping? 0 0  Preparing Food and eating ? N N  Using the Toilet? N N  In the past six months, have you accidently leaked urine? N N  Do you have problems with loss of bowel control? N N  Managing your Medications? N N  Managing your Finances? N N  Housekeeping or managing your Housekeeping? N N    Patient Care Team: Jarold Motto, [place] as PCP - General (Physician Assistant) Ob/Gyn, [facility] as Consulting Physician (Obstetrics and Gynecology)  Indicate any recent Medical Services you may have received from other than [facility] providers in the past year (date may be approximate).     Assessment:   This is a routine wellness examination for [place].  Hearing/Vision screen Hearing Screening - Comments:: Pt denies any hearing issues  Vision Screening - Comments:: Pt follows up with [facility]   Dietary issues and exercise activities discussed: Current Exercise Habits: Home exercise routine, Type of exercise: Other - see comments, Time (Minutes): 30, Frequency (Times/Week): 7, Weekly Exercise (Minutes/Week): 210   Goals Addressed             This Visit's Progress    Patient Stated       Lose weight and exercise        Depression Screen    06/20/2022   10:14 AM 07/13/2021   10:31 AM 06/07/2021   10:11 AM 06/01/2020   10:36 AM 05/09/2019    1:20 PM 12/13/2018   10:52 AM 10/17/2017    8:37 AM  PHQ 2/9 Scores  PHQ - 2 Score 0 0 0 0 0 0 0  PHQ- 9 Score    3 2 2 4     Fall Risk    06/20/2022   10:17 AM 06/16/2022    2:30 PM 07/13/2021   10:36 AM 06/07/2021   10:13 AM  Fall Risk   Falls in the past year? 0 0 0 0  Number falls in past yr: 0 0 0 0  Injury with Fall? 0 0 0 0  Risk for fall due to :  Impaired vision   Impaired vision  Follow up Falls prevention discussed  Falls evaluation completed Falls prevention discussed    FALL RISK PREVENTION PERTAINING TO THE HOME:  Any stairs in or around the home? No  If so, are there any without handrails? No  Home free of loose throw rugs in walkways, pet beds, electrical cords, etc? Yes  Adequate lighting in your home to reduce risk of falls? Yes   ASSISTIVE DEVICES UTILIZED TO PREVENT FALLS:  Life alert? No  Use of a cane, walker or w/c? No  Grab bars in the bathroom? No  Shower chair or bench in shower? No  Elevated toilet seat or a handicapped toilet? No   TIMED UP AND GO:  Was the test performed? No .   Cognitive Function:    06/01/2020   10:37 AM  MMSE - Mini Mental State Exam  Orientation to time 5  Orientation to Place 5  Registration 3  Attention/ Calculation 5  Recall 3  Language- name 2 objects 2  Language- repeat 1  Language- follow 3 step command 3  Language- read & follow direction 1        06/20/2022   10:19 AM 06/07/2021   10:14 AM  6CIT Screen  What Year? 0 points 0 points  What month? 0 points 0 points  What time? 0 points 0 points  Count back from 20 0 points 0 points  Months in reverse 0 points 0 points  Repeat phrase 0 points 0 points  Total Score 0 points 0 points    Immunizations Immunization History  Administered Date(s) Administered   Influenza, High Dose Seasonal PF 04/07/2021   Influenza,inj,Quad PF,6+ Mos 05/01/2017,  05/06/2018, 05/16/2020   Influenza-Unspecified 04/29/2019   PFIZER(Purple Top)SARS-COV-2 Vaccination 09/11/2019, 10/10/2019   PNEUMOCOCCAL CONJUGATE-20 07/13/2021   Tdap 09/18/2013   Zoster Recombinat (Shingrix) 06/26/2018, 08/27/2018    TDAP status: Up to date  Flu Vaccine status: Up to date pt will put in information   Pneumococcal vaccine status: Up to date  Covid-19 vaccine status: Completed vaccines  Qualifies for Shingles Vaccine? Yes   Zostavax  completed Yes   Shingrix Completed?: Yes  Screening Tests Health Maintenance  Topic Date Due   COLONOSCOPY (Pts 45-6438yrs Insurance coverage will need to be confirmed)  09/22/2013   INFLUENZA VACCINE  02/28/2022   COVID-19 Vaccine (3 - 2023-24 season) 03/31/2022   DEXA SCAN  05/31/2022   MAMMOGRAM  07/06/2022   Medicare Annual Wellness (AWV)  06/21/2023   Pneumonia Vaccine 2265+ Years old  Completed   Hepatitis C Screening  Completed   Zoster Vaccines- Shingrix  Completed   HPV VACCINES  Aged Out    Health Maintenance  Health Maintenance Due  Topic Date Due   COLONOSCOPY (Pts 45-6538yrs Insurance coverage will need to be confirmed)  09/22/2013   INFLUENZA VACCINE  02/28/2022   COVID-19 Vaccine (3 - 2023-24 season) 03/31/2022   DEXA SCAN  05/31/2022    Colorectal cancer screening: Referral to GI placed 06/20/22. Pt aware the office will call re: appt.  Mammogram & Bone density pt scheduled for 06/2022     Additional Screening:  Hepatitis C Screening: Completed 03/01/17  Vision Screening: Recommended annual ophthalmology exams for early detection of glaucoma and other disorders of the eye. Is the patient up to date with their annual eye exam?  Yes  Who is the provider or what is the name of the office in which the patient attends annual eye exams? [facility]  If pt is not established with a provider, would they like to be referred to a provider to establish care? No .   Dental Screening: Recommended annual dental exams for proper oral hygiene  Community Resource Referral / Chronic Care Management: CRR required this visit?  No   CCM required this visit?  No      Plan:     I have personally reviewed and noted the following in the patient's chart:   Medical and social history Use of alcohol, tobacco or illicit drugs  Current medications and supplements including opioid prescriptions. Patient is not currently taking opioid prescriptions. Functional ability and  status Nutritional status Physical activity Advanced directives List of other physicians Hospitalizations, surgeries, and ER visits in previous 12 months Vitals Screenings to include cognitive, depression, and falls Referrals and appointments  In addition, I have reviewed and discussed with patient certain preventive protocols, quality metrics, and best practice recommendations. A written personalized care plan for preventive services as well as general preventive health recommendations were provided to patient.     Marzella Schleinina H Marybelle Giraldo, LPN   [id number]/21/2023   Nurse Notes: none

## 2022-06-20 NOTE — Patient Instructions (Signed)
Tracey Moody , Thank you for taking time to come for your Medicare Wellness Visit. I appreciate your ongoing commitment to your health goals. Please review the following plan we discussed and let me know if I can assist you in the future.   These are the goals we discussed:  Goals      Patient Stated     Lose weight      Patient Stated     Lose weight and exercise         This is a list of the screening recommended for you and due dates:  Health Maintenance  Topic Date Due   Colon Cancer Screening  09/22/2013   Flu Shot  02/28/2022   COVID-19 Vaccine (3 - 2023-24 season) 03/31/2022   DEXA scan (bone density measurement)  05/31/2022   Mammogram  07/06/2022   Medicare Annual Wellness Visit  06/21/2023   Pneumonia Vaccine  Completed   Hepatitis C Screening: USPSTF Recommendation to screen - Ages 18-79 yo.  Completed   Zoster (Shingles) Vaccine  Completed   HPV Vaccine  Aged Out    Advanced directives: Please bring a copy of your health care power of attorney and living will to the office at your convenience.  Conditions/risks identified: lose weight and exercise   Next appointment: Follow up in one year for your annual wellness visit    Preventive Care 65 Years and Older, Female Preventive care refers to lifestyle choices and visits with your health care provider that can promote health and wellness. What does preventive care include? A yearly physical exam. This is also called an annual well check. Dental exams once or twice a year. Routine eye exams. Ask your health care provider how often you should have your eyes checked. Personal lifestyle choices, including: Daily care of your teeth and gums. Regular physical activity. Eating a healthy diet. Avoiding tobacco and drug use. Limiting alcohol use. Practicing safe sex. Taking low-dose aspirin every day. Taking vitamin and mineral supplements as recommended by your health care provider. What happens during an annual  well check? The services and screenings done by your health care provider during your annual well check will depend on your age, overall health, lifestyle risk factors, and family history of disease. Counseling  Your health care provider may ask you questions about your: Alcohol use. Tobacco use. Drug use. Emotional well-being. Home and relationship well-being. Sexual activity. Eating habits. History of falls. Memory and ability to understand (cognition). Work and work Astronomer. Reproductive health. Screening  You may have the following tests or measurements: Height, weight, and BMI. Blood pressure. Lipid and cholesterol levels. These may be checked every 5 years, or more frequently if you are over 66 years old. Skin check. Lung cancer screening. You may have this screening every year starting at age 14 if you have a 30-pack-year history of smoking and currently smoke or have quit within the past 15 years. Fecal occult blood test (FOBT) of the stool. You may have this test every year starting at age 61. Flexible sigmoidoscopy or colonoscopy. You may have a sigmoidoscopy every 5 years or a colonoscopy every 10 years starting at age 53. Hepatitis C blood test. Hepatitis B blood test. Sexually transmitted disease (STD) testing. Diabetes screening. This is done by checking your blood sugar (glucose) after you have not eaten for a while (fasting). You may have this done every 1-3 years. Bone density scan. This is done to screen for osteoporosis. You may have this done starting  at age 87. Mammogram. This may be done every 1-2 years. Talk to your health care provider about how often you should have regular mammograms. Talk with your health care provider about your test results, treatment options, and if necessary, the need for more tests. Vaccines  Your health care provider may recommend certain vaccines, such as: Influenza vaccine. This is recommended every year. Tetanus, diphtheria,  and acellular pertussis (Tdap, Td) vaccine. You may need a Td booster every 10 years. Zoster vaccine. You may need this after age 71. Pneumococcal 13-valent conjugate (PCV13) vaccine. One dose is recommended after age 2. Pneumococcal polysaccharide (PPSV23) vaccine. One dose is recommended after age 15. Talk to your health care provider about which screenings and vaccines you need and how often you need them. This information is not intended to replace advice given to you by your health care provider. Make sure you discuss any questions you have with your health care provider. Document Released: 08/13/2015 Document Revised: 04/05/2016 Document Reviewed: 05/18/2015 Elsevier Interactive Patient Education  2017 [place] Prevention in the Home Falls can cause injuries. They can happen to people of all ages. There are many things you can do to make your home safe and to help prevent falls. What can I do on the outside of my home? Regularly fix the edges of walkways and driveways and fix any cracks. Remove anything that might make you trip as you walk through a door, such as a raised step or threshold. Trim any bushes or trees on the path to your home. Use bright outdoor lighting. Clear any walking paths of anything that might make someone trip, such as rocks or tools. Regularly check to see if handrails are loose or broken. Make sure that both sides of any steps have handrails. Any raised decks and porches should have guardrails on the edges. Have any leaves, snow, or ice cleared regularly. Use sand or salt on walking paths during winter. Clean up any spills in your garage right away. This includes oil or grease spills. What can I do in the bathroom? Use night lights. Install grab bars by the toilet and in the tub and shower. Do not use towel bars as grab bars. Use non-skid mats or decals in the tub or shower. If you need to sit down in the shower, use a plastic, non-slip  stool. Keep the floor dry. Clean up any water that spills on the floor as soon as it happens. Remove soap buildup in the tub or shower regularly. Attach bath mats securely with double-sided non-slip rug tape. Do not have throw rugs and other things on the floor that can make you trip. What can I do in the bedroom? Use night lights. Make sure that you have a light by your bed that is easy to reach. Do not use any sheets or blankets that are too big for your bed. They should not hang down onto the floor. Have a firm chair that has side arms. You can use this for support while you get dressed. Do not have throw rugs and other things on the floor that can make you trip. What can I do in the kitchen? Clean up any spills right away. Avoid walking on wet floors. Keep items that you use a lot in easy-to-reach places. If you need to reach something above you, use a strong step stool that has a grab bar. Keep electrical cords out of the way. Do not use floor polish or wax that makes  floors slippery. If you must use wax, use non-skid floor wax. Do not have throw rugs and other things on the floor that can make you trip. What can I do with my stairs? Do not leave any items on the stairs. Make sure that there are handrails on both sides of the stairs and use them. Fix handrails that are broken or loose. Make sure that handrails are as long as the stairways. Check any carpeting to make sure that it is firmly attached to the stairs. Fix any carpet that is loose or worn. Avoid having throw rugs at the top or bottom of the stairs. If you do have throw rugs, attach them to the floor with carpet tape. Make sure that you have a light switch at the top of the stairs and the bottom of the stairs. If you do not have them, ask someone to add them for you. What else can I do to help prevent falls? Wear shoes that: Do not have high heels. Have rubber bottoms. Are comfortable and fit you well. Are closed at the  toe. Do not wear sandals. If you use a stepladder: Make sure that it is fully opened. Do not climb a closed stepladder. Make sure that both sides of the stepladder are locked into place. Ask someone to hold it for you, if possible. Clearly mark and make sure that you can see: Any grab bars or handrails. First and last steps. Where the edge of each step is. Use tools that help you move around (mobility aids) if they are needed. These include: Canes. Walkers. Scooters. Crutches. Turn on the lights when you go into a dark area. Replace any light bulbs as soon as they burn out. Set up your furniture so you have a clear path. Avoid moving your furniture around. If any of your floors are uneven, fix them. If there are any pets around you, be aware of where they are. Review your medicines with your doctor. Some medicines can make you feel dizzy. This can increase your chance of falling. Ask your doctor what other things that you can do to help prevent falls. This information is not intended to replace advice given to you by your health care provider. Make sure you discuss any questions you have with your health care provider. Document Released: 05/13/2009 Document Revised: 12/23/2015 Document Reviewed: 08/21/2014 Elsevier Interactive Patient Education  2017 [employer].

## 2022-06-20 NOTE — Addendum Note (Signed)
Addended by: Marzella Schlein on: 06/20/2022 11:12 AM   Modules accepted: Orders

## 2022-07-12 LAB — HM MAMMOGRAPHY

## 2022-07-13 ENCOUNTER — Encounter: Payer: Self-pay | Admitting: *Deleted

## 2022-07-14 ENCOUNTER — Other Ambulatory Visit: Payer: Self-pay | Admitting: Physician Assistant

## 2022-07-17 ENCOUNTER — Encounter: Payer: Self-pay | Admitting: Physician Assistant

## 2022-07-17 ENCOUNTER — Ambulatory Visit (INDEPENDENT_AMBULATORY_CARE_PROVIDER_SITE_OTHER): Payer: Medicare Other | Admitting: Physician Assistant

## 2022-07-17 ENCOUNTER — Ambulatory Visit (INDEPENDENT_AMBULATORY_CARE_PROVIDER_SITE_OTHER)
Admission: RE | Admit: 2022-07-17 | Discharge: 2022-07-17 | Disposition: A | Discharge status: Home / Self Care | Payer: Medicare Other | Source: Ambulatory Visit | Attending: Physician Assistant | Admitting: Physician Assistant

## 2022-07-17 VITALS — BP 110/74 | HR 67 | Temp 97.8°F | Ht 61.5 in | Wt 147.4 lb

## 2022-07-17 DIAGNOSIS — M5442 Lumbago with sciatica, left side: Secondary | ICD-10-CM

## 2022-07-17 DIAGNOSIS — G8929 Other chronic pain: Secondary | ICD-10-CM

## 2022-07-17 DIAGNOSIS — M816 Localized osteoporosis [Lequesne]: Secondary | ICD-10-CM | POA: Diagnosis not present

## 2022-07-17 DIAGNOSIS — E785 Hyperlipidemia, unspecified: Secondary | ICD-10-CM | POA: Diagnosis not present

## 2022-07-17 DIAGNOSIS — E663 Overweight: Secondary | ICD-10-CM

## 2022-07-17 LAB — LIPID PANEL
Cholesterol: 193 mg/dL (ref 0–200)
HDL: 77.1 mg/dL (ref >39.00)
LDL Cholesterol: 102 mg/dL — ABNORMAL HIGH (ref 0–99)
NonHDL: 115.72
Total CHOL/HDL Ratio: 3
Triglycerides: 68 mg/dL (ref 0.0–149.0)
VLDL: 13.6 mg/dL (ref 0.0–40.0)

## 2022-07-17 LAB — CBC WITH DIFFERENTIAL/PLATELET
Basophils Absolute: 0 10*3/uL (ref 0.0–0.1)
Basophils Relative: 0.8 % (ref 0.0–3.0)
Eosinophils Absolute: 0.3 10*3/uL (ref 0.0–0.7)
Eosinophils Relative: 4.8 % (ref 0.0–5.0)
HCT: 41.4 % (ref 36.0–46.0)
Hemoglobin: 14 g/dL (ref 12.0–15.0)
Lymphocytes Relative: 42.2 % (ref 12.0–46.0)
Lymphs Abs: 2.5 10*3/uL (ref 0.7–4.0)
MCHC: 33.9 g/dL (ref 30.0–36.0)
MCV: 94 fl (ref 78.0–100.0)
Monocytes Absolute: 0.3 10*3/uL (ref 0.1–1.0)
Monocytes Relative: 5.1 % (ref 3.0–12.0)
Neutro Abs: 2.7 10*3/uL (ref 1.4–7.7)
Neutrophils Relative %: 47.1 % (ref 43.0–77.0)
Platelets: 237 10*3/uL (ref 150.0–400.0)
RBC: 4.4 Mil/uL (ref 3.87–5.11)
RDW: 13.2 % (ref 11.5–15.5)
WBC: 5.8 10*3/uL (ref 4.0–10.5)

## 2022-07-17 LAB — COMPREHENSIVE METABOLIC PANEL
ALT: 15 U/L (ref 0–35)
AST: 18 U/L (ref 0–37)
Albumin: 4.7 g/dL (ref 3.5–5.2)
Alkaline Phosphatase: 47 U/L (ref 39–117)
BUN: 13 mg/dL (ref 6–23)
CO2: 29 mEq/L (ref 19–32)
Calcium: 10 mg/dL (ref 8.4–10.5)
Chloride: 107 mEq/L (ref 96–112)
Creatinine, Ser: 0.72 mg/dL (ref 0.40–1.20)
GFR: 86.72 mL/min (ref >60.00)
Glucose, Bld: 86 mg/dL (ref 70–99)
Potassium: 5.5 mEq/L — ABNORMAL HIGH (ref 3.5–5.1)
Sodium: 145 mEq/L (ref 135–145)
Total Bilirubin: 0.7 mg/dL (ref 0.2–1.2)
Total Protein: 7 g/dL (ref 6.0–8.3)

## 2022-07-17 LAB — HM DEXA SCAN

## 2022-07-17 NOTE — Progress Notes (Signed)
Tracey Moody is a 66 y.o. female here for annual review of chronic medical issues.  History of Present Illness:   Chief Complaint  Patient presents with   Annual Exam    Fasting   Back Pain    Pt c/o chronic low back pain    HPI  Chronic Lower Back Pain: Patient complains of chronic left lower back pain during this visit. She notes her back pain was worse during thanksgiving break due to traveling. Patient reports that her pain worsens when she sits. She has had this pain for a long period of time, but has worsen this year. Patient has not had an X-Ray since 2017. Denies saddle anesthesia, weakness, bowel/bladder incontinence.  Osteoporosis: Continues on fosamax 70 mg weekly. Tolerating well. Repeat DEXA is this afternoon.  Mammogram: She had an mammogram last Wednesday and notes it was normal.   Colonoscopy: She is due for colonoscopy. Patient was scheduled for colonoscopy in September, but had to cancel due to transportation.  Immunization: UTD on all except for updated COVID and RSV  HLD: currently taking lipitor 40 mg daily and tolerating well. Denies myalgias.  Exercise/diet: Patient notes she has been eating well and has been staying physically active.   Dermatology: She regularly follows-up with a dermatologist.   Sleep: She has been sleeping well.   Social history: Patient notes her oldest sibling has COPD and other siblings have hyperlipidemia. She denies smoking cigarettes or using any tobacco products. She reports she rarely drinks alcohol.     Past Medical History:  Diagnosis Date   Hyperlipidemia      Social History   Tobacco Use   Smoking status: Never   Smokeless tobacco: Never  Vaping Use   Vaping Use: Never used  Substance Use Topics   Alcohol use: Yes    Comment: occassionally beer   Drug use: No    Past Surgical History:  Procedure Laterality Date   APPENDECTOMY  26   CESAREAN SECTION  1979, 1980, 1983, 1984, 1987   TONSILLECTOMY   1960    Family History  Problem Relation Age of Onset   Lung cancer Mother    Osteoporosis Mother    Skin cancer Father    Hypertension Father    Hypercholesterolemia Sister    Heart attack Sister    Hypercholesterolemia Sister    Hypercholesterolemia Brother    Hypercholesterolemia Brother    CVA Maternal Grandmother    Alzheimer's disease Maternal Grandmother    Breast cancer Neg Hx    Colon cancer Neg Hx    Colon polyps Neg Hx    Esophageal cancer Neg Hx    Stomach cancer Neg Hx    Rectal cancer Neg Hx     No Known Allergies  Current Medications:   Current Outpatient Medications:    alendronate (FOSAMAX) 70 MG tablet, Take 1 tablet (70 mg total) by mouth every 7 (seven) days. Take with a full glass of water on an empty stomach., Disp: 12 tablet, Rfl: 3   aspirin EC 81 MG tablet, Take 81 mg by mouth daily., Disp: , Rfl:    atorvastatin (LIPITOR) 40 MG tablet, TAKE 1 TABLET BY MOUTH EVERY DAY, Disp: 90 tablet, Rfl: 3   b complex vitamins tablet, Take 1 tablet by mouth daily., Disp: , Rfl:    Baclofen 5 MG TABS, TAKE 1 TABLET BY MOUTH EVERY DAY AT BEDTIME AS NEEDED, Disp: 90 tablet, Rfl: 0   BIOTIN PO, Take 1 tablet by mouth  daily., Disp: , Rfl:    Calcium Citrate (CITRACAL PO), Take 1,200 mg by mouth daily. , Disp: , Rfl:    Cholecalciferol (VITAMIN D3) 2000 units TABS, Take 1 tablet by mouth daily., Disp: , Rfl:    Echinacea 500 MG CAPS, Take 2 capsules by mouth daily., Disp: , Rfl:    famotidine (PEPCID) 20 MG tablet, TAKE 1 TABLET DAILY, Disp: 90 tablet, Rfl: 1   naproxen sodium (ANAPROX) 220 MG tablet, Take 220 mg by mouth as needed., Disp: , Rfl:    Omega-3 Fatty Acids (FISH OIL) 1000 MG CPDR, Take 1 capsule by mouth daily., Disp: , Rfl:    Turmeric 500 MG CAPS, Take 1 capsule by mouth daily., Disp: , Rfl:    Review of Systems:   Review of Systems  Constitutional:  Negative for chills, fever, malaise/fatigue and weight loss.  HENT:  Negative for hearing loss,  sinus pain and sore throat.   Respiratory:  Negative for cough and hemoptysis.   Cardiovascular:  Negative for chest pain, palpitations, orthopnea, leg swelling and PND.  Gastrointestinal:  Negative for abdominal pain, constipation, diarrhea, heartburn, nausea and vomiting.  Genitourinary:  Negative for dysuria, frequency and urgency.  Musculoskeletal:  Negative for back pain, myalgias and neck pain.       (+) Lower Back Pain   Skin:  Negative for itching and rash.  Endo/Heme/Allergies:  Negative for polydipsia.  Psychiatric/Behavioral:  Negative for depression. The patient is not nervous/anxious.     Vitals:   Vitals:   07/17/22 1021  BP: 110/74  Pulse: 67  Temp: 97.8 F (36.6 C)  TempSrc: Temporal  SpO2: 100%  Weight: 147 lb 6.1 oz (66.9 kg)  Height: 5' 1.5" (1.562 m)     Body mass index is 27.4 kg/m.  Physical Exam:   Physical Exam Constitutional:      General: She is not in acute distress.    Appearance: Normal appearance. She is not ill-appearing.  HENT:     Head: Normocephalic and atraumatic.     Right Ear: External ear normal.     Left Ear: External ear normal.  Eyes:     Extraocular Movements: Extraocular movements intact.     Pupils: Pupils are equal, round, and reactive to light.  Cardiovascular:     Rate and Rhythm: Normal rate and regular rhythm.     Pulses: Normal pulses.     Heart sounds: Normal heart sounds. No murmur heard.    No gallop.  Pulmonary:     Effort: Pulmonary effort is normal. No respiratory distress.     Breath sounds: Normal breath sounds. No wheezing or rales.  Musculoskeletal:     Comments: No decreased ROM 2/2 pain with flexion/extension, lateral side bends, or rotation. R No bony tenderness. No evidence of erythema, rash or ecchymosis.   Skin:    General: Skin is warm and dry.  Neurological:     Mental Status: She is alert and oriented to person, place, and time.  Psychiatric:        Judgment: Judgment normal.      Assessment and Plan:   Chronic left-sided low back pain with left-sided sciatica Due to chronic nature as well as osteoporosis, will order lumbar xray to further evaluate Continue PT exercises from daughter Low threshold to send to sports medicine for further eval No red flags  Osteoporosis Continue fosamax 70 mg weekly Repeat DEXA today, will await results  Hyperlipidemia, unspecified hyperlipidemia type Update lipid panel and adjust  atorvastatin 40 mg as indicated  Overweight Continue healthy lifestyle efforts  I,Param Shah,acting as a scribe for [employer], PA.,have documented all relevant documentation on the behalf of Jarold Motto, PA,as directed by  Jarold Motto, PA while in the presence of Jarold Motto, [place].  I, Jarold Motto, [place], have reviewed all documentation for this visit. The documentation on 07/17/22 for the exam, diagnosis, procedures, and orders are all accurate and complete.  Jarold Motto, PA-C

## 2022-07-17 NOTE — Patient Instructions (Addendum)
It was great to see you!  Please try to get your colonoscopy done.  An order for xray has been put in for you. To have this done, you can walk in at the [facility] location without a scheduled appointment.  The address is [address] It is across the street from [facility]. Lab and x-xray are located in the basement.   Hours of operation are M-F 8:30am to 5:00pm.  Please note that they are closed for lunch between 12:30 and 1:00pm.  Please go to the lab for blood work.   Our office will call you with your results unless you have chosen to receive results via MyChart.  If your blood work is normal we will follow-up each year for physicals and as scheduled for chronic medical problems.  If anything is abnormal we will treat accordingly and get you in for a follow-up.  Take care,  Lelon Mast

## 2022-07-18 ENCOUNTER — Other Ambulatory Visit (INDEPENDENT_AMBULATORY_CARE_PROVIDER_SITE_OTHER): Payer: Medicare Other

## 2022-07-18 ENCOUNTER — Other Ambulatory Visit: Payer: Self-pay | Admitting: Physician Assistant

## 2022-07-18 DIAGNOSIS — E875 Hyperkalemia: Secondary | ICD-10-CM | POA: Diagnosis not present

## 2022-07-18 LAB — BASIC METABOLIC PANEL
BUN: 14 mg/dL (ref 6–23)
CO2: 29 mEq/L (ref 19–32)
Calcium: 9.7 mg/dL (ref 8.4–10.5)
Chloride: 105 mEq/L (ref 96–112)
Creatinine, Ser: 0.63 mg/dL (ref 0.40–1.20)
GFR: 92.01 mL/min (ref >60.00)
Glucose, Bld: 93 mg/dL (ref 70–99)
Potassium: 4.3 mEq/L (ref 3.5–5.1)
Sodium: 141 mEq/L (ref 135–145)

## 2022-07-27 ENCOUNTER — Encounter: Payer: Self-pay | Admitting: Physician Assistant

## 2022-08-22 ENCOUNTER — Encounter: Payer: Self-pay | Admitting: Physician Assistant

## 2022-08-31 ENCOUNTER — Other Ambulatory Visit: Payer: Self-pay | Admitting: Physician Assistant

## 2022-08-31 MED ORDER — BACLOFEN 5 MG PO TABS: 5.0000 mg | ORAL_TABLET | Freq: Every day | ORAL | 0 refills | Status: DC | PRN

## 2022-12-19 ENCOUNTER — Ambulatory Visit (INDEPENDENT_AMBULATORY_CARE_PROVIDER_SITE_OTHER): Payer: Medicare Other | Admitting: Physician Assistant

## 2022-12-19 ENCOUNTER — Encounter: Payer: Self-pay | Admitting: Physician Assistant

## 2022-12-19 VITALS — BP 130/80 | HR 61 | Temp 97.5°F | Ht 61.5 in | Wt 149.0 lb

## 2022-12-19 DIAGNOSIS — R0781 Pleurodynia: Secondary | ICD-10-CM | POA: Diagnosis not present

## 2022-12-19 NOTE — Patient Instructions (Signed)
It was great to see you!  A referral has been placed for you to see one of our fantastic providers at [facility]. Someone from their office will be in touch soon regarding scheduling your appointment.  Their location:  [facility] [place]  [address] on the 1st floor Phone number [phone] Fax [phone].   This location is across the street from the entrance to [facility] and in the same complex as the [facility]   Take care,  Kayshawn Ozburn PA-C  

## 2022-12-19 NOTE — Progress Notes (Signed)
Tracey Moody is a 67 y.o. female here for a new problem.  History of Present Illness:   Chief Complaint  Patient presents with   c/o rib pain    Pt c/o right sided rib pain constant dull pain x 1 month, if she leans over and comes back up the pain is worse, uses Alleve    HPI  Right rib pain Has been going on for at least one month Has pain all day, pain is not sharp Hasn't stopped her from doing anything  Most pronounced when she is standing up after in a bent over position (like brushing teeth) -- feels a tight pain in right rib cage  Denies: recent upper respiratory infection (URI), severe pain, bowel/bladder changes, severe cough  Did have an incident a few months back where she had to bend over a railing and felt a "pop" in her R rib area  Takes Aleve night for back pain    Past Medical History:  Diagnosis Date   Hyperlipidemia      Social History   Tobacco Use   Smoking status: Never   Smokeless tobacco: Never  Vaping Use   Vaping Use: Never used  Substance Use Topics   Alcohol use: Yes    Comment: occassionally beer   Drug use: No    Past Surgical History:  Procedure Laterality Date   APPENDECTOMY  35   CESAREAN SECTION  1979, 1980, 1983, 1984, 1987   TONSILLECTOMY  1960    Family History  Problem Relation Age of Onset   Lung cancer Mother    Osteoporosis Mother    Skin cancer Father    Hypertension Father    Hypercholesterolemia Sister    Heart attack Sister    Hypercholesterolemia Sister    Hypercholesterolemia Brother    Hypercholesterolemia Brother    CVA Maternal Grandmother    Alzheimer's disease Maternal Grandmother    Breast cancer Neg Hx    Colon cancer Neg Hx    Colon polyps Neg Hx    Esophageal cancer Neg Hx    Stomach cancer Neg Hx    Rectal cancer Neg Hx     No Known Allergies  Current Medications:   Current Outpatient Medications:    alendronate (FOSAMAX) 70 MG tablet, Take 1 tablet (70 mg total) by mouth  every 7 (seven) days. Take with a full glass of water on an empty stomach., Disp: 12 tablet, Rfl: 3   aspirin EC 81 MG tablet, Take 81 mg by mouth daily., Disp: , Rfl:    atorvastatin (LIPITOR) 40 MG tablet, TAKE 1 TABLET BY MOUTH EVERY DAY, Disp: 90 tablet, Rfl: 3   b complex vitamins tablet, Take 1 tablet by mouth daily., Disp: , Rfl:    Baclofen 5 MG TABS, Take 1 tablet (5 mg total) by mouth daily as needed., Disp: 90 tablet, Rfl: 0   BIOTIN PO, Take 1 tablet by mouth daily., Disp: , Rfl:    Calcium Citrate (CITRACAL PO), Take 1,200 mg by mouth daily. , Disp: , Rfl:    Cholecalciferol (VITAMIN D3) 2000 units TABS, Take 1 tablet by mouth daily., Disp: , Rfl:    Echinacea 500 MG CAPS, Take 2 capsules by mouth daily., Disp: , Rfl:    famotidine (PEPCID) 20 MG tablet, TAKE 1 TABLET DAILY, Disp: 90 tablet, Rfl: 1   naproxen sodium (ANAPROX) 220 MG tablet, Take 220 mg by mouth as needed., Disp: , Rfl:    Omega-3 Fatty Acids (FISH  OIL) 1000 MG CPDR, Take 1 capsule by mouth daily., Disp: , Rfl:    Turmeric 500 MG CAPS, Take 1 capsule by mouth daily., Disp: , Rfl:    Review of Systems:   ROS Negative unless otherwise specified per HPI.   Vitals:   Vitals:   12/19/22 1110  BP: 130/80  Pulse: 61  Temp: (!) 97.5 F (36.4 C)  TempSrc: Temporal  SpO2: 100%  Weight: 149 lb (67.6 kg)  Height: 5' 1.5" (1.562 m)     Body mass index is 27.7 kg/m.  Physical Exam:   Physical Exam Vitals and nursing note reviewed.  Constitutional:      General: She is not in acute distress.    Appearance: She is well-developed. She is not ill-appearing or toxic-appearing.  Cardiovascular:     Rate and Rhythm: Normal rate and regular rhythm.     Pulses: Normal pulses.     Heart sounds: Normal heart sounds, S1 normal and S2 normal.  Pulmonary:     Effort: Pulmonary effort is normal.     Breath sounds: Normal breath sounds.  Chest:     Comments: Tenderness to right lateral lower rib cage with deep  palpation No deformity Abdominal:     General: Abdomen is flat. Bowel sounds are normal.     Palpations: Abdomen is soft.     Tenderness: There is no abdominal tenderness.  Skin:    General: Skin is warm and dry.  Neurological:     Mental Status: She is alert.     GCS: GCS eye subscore is 4. GCS verbal subscore is 5. GCS motor subscore is 6.  Psychiatric:        Speech: Speech normal.        Behavior: Behavior normal. Behavior is cooperative.     Assessment and Plan:   Rib pain on right side Possible rib contusion  Due to chronicity of pain, will refer to sports medicine for evaluation -- will defer imaging to them She is concerned that this is related to fosamax -- she may hold this if she likes for a month to see if this makes a difference but I encouraged her to discuss with our sport medicine team    Jarold Motto, PA-C

## 2022-12-26 ENCOUNTER — Encounter: Payer: Self-pay | Admitting: Family Medicine

## 2022-12-26 ENCOUNTER — Ambulatory Visit (INDEPENDENT_AMBULATORY_CARE_PROVIDER_SITE_OTHER): Payer: Medicare Other

## 2022-12-26 ENCOUNTER — Ambulatory Visit (INDEPENDENT_AMBULATORY_CARE_PROVIDER_SITE_OTHER): Payer: Medicare Other | Admitting: Family Medicine

## 2022-12-26 ENCOUNTER — Telehealth: Payer: Self-pay

## 2022-12-26 VITALS — BP 126/82 | HR 72 | Ht 61.5 in | Wt 148.8 lb

## 2022-12-26 DIAGNOSIS — M816 Localized osteoporosis [Lequesne]: Secondary | ICD-10-CM

## 2022-12-26 DIAGNOSIS — R0781 Pleurodynia: Secondary | ICD-10-CM | POA: Diagnosis not present

## 2022-12-26 NOTE — Telephone Encounter (Signed)
Per pt message:  Tracey Moody [facility] (supporting Rodolph Bong, MD)11 minutes ago (11:15 AM)   RR I would like to start prolia and discontinue the fosamax

## 2022-12-26 NOTE — Patient Instructions (Addendum)
Thank you for coming in today.  Check out Dr. Jean Rosenthal or Dr. Katrinka Blazing here at [facility] for OMT or Ernesta Amble at [facility] for Chiropractic adjustments.   Please get an Xray today before you leave   I've referred you to Physical Therapy.  Let us know if you don't hear from them in one week.   Let me know about Prolia.   If you want to try OMT with Dr Jean Rosenthal or Dr Katrinka Blazing ok to schedule or let me know.

## 2022-12-26 NOTE — Telephone Encounter (Signed)
Prolia VOB initiated via [email or web address]  New start  Prior treatments: Fosamax

## 2022-12-26 NOTE — Progress Notes (Signed)
I, Stevenson Clinch, CMA acting as a scribe for Clementeen Graham, MD.   Tracey Moody is a 67 y.o. female who presents to [facility] at [facility] today for rib pain ongoing for over a month. Pt locates pain to the R-side of her rib cage. Pain is most pronounced when she is standing up after in a bent over position (like brushing teeth), feels a tight pain along the R-side of her rib cage. She does recall an incident about a year ago where she had to bend over a railing and felt a "pop" in her R rib area. Sometimes feels like a pulled muscle, other times feels like a fracture. No relief with Naproxen. Feels like the ribs are catching at times.   Aggravates: standing upright from bent forward position.  Treatments tried: naproxen  Dx imaging: 07/17/22 L-spine XR  Pertinent review of systems: No fevers or chills  Relevant historical information: Osteoporosis on Fosamax.  She will be on it for 5 years October 2024   Exam:  BP 126/82   Pulse 72   Ht 5' 1.5" (1.562 m)   Wt 148 lb 12.8 oz (67.5 kg)   SpO2 99%   BMI 27.66 kg/m  General: Well Developed, well nourished, and in no acute distress.   MSK: Right thoracic spine and lateral inferior rib margin normal-appearing Nontender palpation spinal midline.  Ribs are minimally tender to palpation.  Normal thoracic and scapular motion.  Strength is intact.    Lab and Radiology Results  X-ray images ribs and chest obtained today personally and independently interpreted No acute fractures are visible.  Marker placed near the lateral false ribs. Await formal radiology review    Assessment and Plan: 66 y.o. female with right rib pain ongoing for about a month.  Concern for muscle strain or dysfunction.  There may be a rib fracture that I cannot see myself.  Radiology overread is still pending.  Plan for trial of physical therapy.  Consider DO OMT region chiropractor manipulation if not improving.  We also talked about  her osteoporosis.  She had an initial DEXA scan in 2019 that showed osteoporosis and has been on Fosamax since.  Her bone density did increase.  It is not clear how long people should be on Fosamax 5 years is often standard.  After completing 5-year course of Fosamax it may be reasonable to switch to a medicine like Prolia.  She will let me know if she would like to investigate this further.   PDMP not reviewed this encounter. Orders Placed This Encounter  Procedures   DG Ribs Unilateral W/Chest Right    Standing Status:   Future    Number of Occurrences:   1    Standing Expiration Date:   01/26/2023    Order Specific Question:   Reason for Exam (SYMPTOM  OR DIAGNOSIS REQUIRED)    Answer:   right-side rib pain    Order Specific Question:   Preferred imaging location?    Answer:   [facility]   Ambulatory referral to Physical Therapy    Referral Priority:   Routine    Referral Type:   Physical Medicine    Referral Reason:   Specialty Services Required    Requested Specialty:   Physical Therapy    Number of Visits Requested:   1   No orders of the defined types were placed in this encounter.    Discussed warning signs or symptoms. Please see discharge  instructions. Patient expresses understanding.   The above documentation has been reviewed and is accurate and complete Clementeen Graham, M.D.

## 2022-12-27 NOTE — Telephone Encounter (Signed)
Hold for OP labs.

## 2022-12-29 NOTE — Telephone Encounter (Signed)
I called and talked to [place].  I think it is okay to hold off on osteoporosis medicines until her physical with Sam in November.  Would recommend Prolia but it is okay to wait a little bit.  I do not wanted to get forgotten about.

## 2023-01-01 NOTE — Progress Notes (Signed)
Right rib and chest x-ray shows no fractures or significant abnormalities.

## 2023-01-02 ENCOUNTER — Ambulatory Visit (INDEPENDENT_AMBULATORY_CARE_PROVIDER_SITE_OTHER): Payer: Medicare Other | Admitting: Physical Therapy

## 2023-01-02 DIAGNOSIS — M546 Pain in thoracic spine: Secondary | ICD-10-CM

## 2023-01-02 NOTE — Therapy (Signed)
OUTPATIENT PHYSICAL THERAPY THORACOLUMBAR EVALUATION   Patient Name: Tracey Moody MRN: [id number] DOB:March 14, 1956, 67 y.o., female Today's Date: 01/02/2023  END OF SESSION:   Past Medical History:  Diagnosis Date   Hyperlipidemia    Past Surgical History:  Procedure Laterality Date   APPENDECTOMY  45   CESAREAN SECTION  1979, 1980, 1983, 1984, 1987   TONSILLECTOMY  1960   Patient Active Problem List   Diagnosis Date Noted   Overweight 06/01/2020   Hyperlipidemia 06/01/2020   Bradycardia 06/01/2020   Osteoporosis 05/22/2018   Depression, major, single episode, mild (HCC) 02/15/2017   Gastritis 02/15/2017   Vitamin D deficiency 02/15/2017   Chronic pain of both knees 02/15/2017   Solar lentigo 02/15/2017    PCP: ***  REFERRING PROVIDER: ***  REFERRING DIAG: ***  Rationale for Evaluation and Treatment: Rehabilitation  THERAPY DIAG:  No diagnosis found.  ONSET DATE: ***  SUBJECTIVE:                                                                                                                                                                                           SUBJECTIVE STATEMENT:  R side torso pain.  Can do a lot without pain,  pain inconsistent when leaing over, housework,  Also has low back pain- not having any now. 2019- fell onto butt, and had comp fracture?  Some L sciatic issues - now.  At end of day when sitting or laying down to bed.  Has had neck issues with tingly hands, ok now.  Very active at home, has 5 grandchildren. Likes to walk- no back pain. Goes to [employer], t-mill or weight circuits.    PERTINENT HISTORY: Osteoporosis/penia    PAIN:  Are you having pain? Yes: NPRS scale: 3/10 Pain location: *** Pain description: *** Aggravating factors: *** Relieving factors: ***  PRECAUTIONS: None  WEIGHT BEARING RESTRICTIONS: {Yes ***/No:24003}  FALLS:  Has patient fallen in last 6 months? {fallsyesno:27318}  LIVING  ENVIRONMENT: Lives with: {OPRC lives with:25569::"lives with their family"} Lives in: {Lives in:25570} Stairs: {opstairs:27293} Has following equipment at home: {Assistive devices:23999}  OCCUPATION: ***  PLOF: {PLOF:24004}  PATIENT GOALS: ***  NEXT MD VISIT: ***  OBJECTIVE:   DIAGNOSTIC FINDINGS:  ***  PATIENT SURVEYS:    COGNITION: Overall cognitive status: Within functional limits for tasks assessed     SENSATION: {sensation:27233}  MUSCLE LENGTH: Hamstrings: Right *** deg; Left *** deg Thomas test: Right *** deg; Left *** deg  POSTURE: {posture:25561}  PALPATION: ***  LUMBAR ROM:   AROM eval  Flexion   Extension   Right lateral flexion   Left lateral  flexion   Right rotation   Left rotation    (Blank rows = not tested)  LOWER EXTREMITY ROM:     {AROM/PROM:27142}  Right eval Left eval  Hip flexion    Hip extension    Hip abduction    Hip adduction    Hip internal rotation    Hip external rotation    Knee flexion    Knee extension    Ankle dorsiflexion    Ankle plantarflexion    Ankle inversion    Ankle eversion     (Blank rows = not tested)  LOWER EXTREMITY MMT:    MMT Right eval Left eval  Hip flexion    Hip extension    Hip abduction    Hip adduction    Hip internal rotation    Hip external rotation    Knee flexion    Knee extension    Ankle dorsiflexion    Ankle plantarflexion    Ankle inversion    Ankle eversion     (Blank rows = not tested)  LUMBAR SPECIAL TESTS:  {lumbar special test:25242}  FUNCTIONAL TESTS:  {Functional tests:24029}  GAIT: Distance walked: *** Assistive device utilized: {Assistive devices:23999} Level of assistance: {Levels of assistance:24026} Comments: ***  TODAY'S TREATMENT:                                                                                                                              DATE: ***    PATIENT EDUCATION:  Education details: *** Person educated: {Person  educated:25204} Education method: {Education Method:25205} Education comprehension: {Education Comprehension:25206}  HOME EXERCISE PROGRAM: ***  ASSESSMENT:  CLINICAL IMPRESSION: Patient is a *** y.o. *** who was seen today for physical therapy evaluation and treatment for ***.   OBJECTIVE IMPAIRMENTS: {opptimpairments:25111}.   ACTIVITY LIMITATIONS: {activitylimitations:27494}  PARTICIPATION LIMITATIONS: {participationrestrictions:25113}  PERSONAL FACTORS: {Personal factors:25162} are also affecting patient's functional outcome.   REHAB POTENTIAL: {rehabpotential:25112}  CLINICAL DECISION MAKING: {clinical decision making:25114}  EVALUATION COMPLEXITY: {Evaluation complexity:25115}   GOALS: Goals reviewed with patient? {yes/no:20286}  SHORT TERM GOALS: Target date: ***  *** Baseline: Goal status: {GOALSTATUS:25110}  2.  *** Baseline:  Goal status: {GOALSTATUS:25110}  3.  *** Baseline:  Goal status: {GOALSTATUS:25110}  4.  *** Baseline:  Goal status: {GOALSTATUS:25110}  5.  *** Baseline:  Goal status: {GOALSTATUS:25110}  6.  *** Baseline:  Goal status: {GOALSTATUS:25110}  LONG TERM GOALS: Target date: ***  *** Baseline:  Goal status: {GOALSTATUS:25110}  2.  *** Baseline:  Goal status: {GOALSTATUS:25110}  3.  *** Baseline:  Goal status: {GOALSTATUS:25110}  4.  *** Baseline:  Goal status: {GOALSTATUS:25110}  5.  *** Baseline:  Goal status: {GOALSTATUS:25110}  6.  *** Baseline:  Goal status: {GOALSTATUS:25110}  PLAN:  PT FREQUENCY: {rehab frequency:25116}  PT DURATION: {rehab duration:25117}  PLANNED INTERVENTIONS: {rehab planned interventions:25118::"Therapeutic exercises","Therapeutic activity","Neuromuscular re-education","Balance training","Gait training","Patient/Family education","Self Care","Joint mobilization"}.  PLAN FOR NEXT SESSION: ***   Sedalia Muta, PT 01/02/2023, 10:17 AM

## 2023-01-03 ENCOUNTER — Encounter: Payer: Self-pay | Admitting: Physical Therapy

## 2023-01-04 ENCOUNTER — Encounter: Payer: Self-pay | Admitting: Physical Therapy

## 2023-01-04 ENCOUNTER — Ambulatory Visit (INDEPENDENT_AMBULATORY_CARE_PROVIDER_SITE_OTHER): Payer: Medicare Other | Admitting: Physical Therapy

## 2023-01-04 DIAGNOSIS — M546 Pain in thoracic spine: Secondary | ICD-10-CM

## 2023-01-04 NOTE — Therapy (Signed)
OUTPATIENT PHYSICAL THERAPY THORACOLUMBAR TREATMENT   Patient Name: Tracey Moody MRN: [id number] DOB:July 21, 1956, 67 y.o., female Today's Date: 01/04/2023  END OF SESSION:  PT End of Session - 01/04/23 0843     Visit Number 2    Number of Visits 16    Date for PT Re-Evaluation 02/27/23    Authorization Type Medicare    PT Start Time 0805    PT Stop Time 0843    PT Time Calculation (min) 38 min    Activity Tolerance Patient tolerated treatment well    Behavior During Therapy [facility] for tasks assessed/performed              Past Medical History:  Diagnosis Date   Hyperlipidemia    Past Surgical History:  Procedure Laterality Date   APPENDECTOMY  43   CESAREAN SECTION  1979, 1980, 1983, 1984, 1987   TONSILLECTOMY  1960   Patient Active Problem List   Diagnosis Date Noted   Overweight 06/01/2020   Hyperlipidemia 06/01/2020   Bradycardia 06/01/2020   Osteoporosis 05/22/2018   Depression, major, single episode, mild (HCC) 02/15/2017   Gastritis 02/15/2017   Vitamin D deficiency 02/15/2017   Chronic pain of both knees 02/15/2017   Solar lentigo 02/15/2017    PCP: Jarold Motto   REFERRING PROVIDER: Clementeen Graham  REFERRING DIAG: R rib pain   Rationale for Evaluation and Treatment: Rehabilitation  THERAPY DIAG:  Acute right-sided thoracic back pain  ONSET DATE: 2 months ago  SUBJECTIVE:                                                                                                                                                                                           SUBJECTIVE STATEMENT: Pt with no new complaints.   Eval: Pt states R side torso pain.  States sh can do a lot of activity without pain and that the pain is inconsistent . Feels it at times when leaning, reaching  over, and with housework or standing/working in kitchen a lot.   Also has had low back pain in the past- not having any now. 2019- fell onto butt, and had comp fracture?   States Some L sciatic issues from time to time, more At end of day when sitting or laying down to bed.  Has had neck issues with tingly hands, doing ok now.  Very active at home, has 5 grandchildren. Likes to walk- no back pain. Goes to [employer], t-mill or weight circuits.   PERTINENT HISTORY: Osteoporosis/penia   PAIN:  Are you having pain? Yes: NPRS scale: 3/10 Pain location: R lateral torso Pain description: sore Aggravating  factors: turning, increased housework Relieving factors: none stated  PRECAUTIONS: None  WEIGHT BEARING RESTRICTIONS: No  FALLS:  Has patient fallen in last 6 months? No  PLOF: Independent  PATIENT GOALS: decreased pain   NEXT MD VISIT:   OBJECTIVE:   DIAGNOSTIC FINDINGS:   PATIENT SURVEYS:   COGNITION: Overall cognitive status: Within functional limits for tasks assessed     SENSATION:   POSTURE:  mild rounding of shoulders   PALPATION: No pain to palpate R ribs today,   Pain with mid thoracic PA s , no radiating pain laterally   LUMBAR ROM:   AROM eval  Flexion WFL  Extension WFL  Right lateral flexion WFL  feels stretch  Left lateral flexion WFL   feels stretch  Right rotation   Left rotation    (Blank rows = not tested)  LOWER EXTREMITY ROM:     Active  Right eval Left eval  Hip flexion    Hip extension    Hip abduction    Hip adduction    Hip internal rotation    Hip external rotation    Knee flexion    Knee extension    Ankle dorsiflexion    Ankle plantarflexion    Ankle inversion    Ankle eversion     (Blank rows = not tested)  LOWER EXTREMITY MMT:    UE: shoulders: 4+/5 ,  scapular: 4-/5   MMT Right eval Left eval  Hip flexion 4+ 4+  Hip extension    Hip abduction 4 4  Hip adduction    Hip internal rotation    Hip external rotation    Knee flexion 5 5  Knee extension 5 5  Ankle dorsiflexion    Ankle plantarflexion    Ankle inversion    Ankle eversion     (Blank rows = not  tested)  LUMBAR SPECIAL TESTS:  Thoracic:  no pain with deep breathing, no pain with rib palpation or mobilization Reproduction of symptoms with thoracic ext with R SB.   TODAY'S TREATMENT:                                                                                                                              DATE:   01/04/23: Therapeutic Exercise: Aerobic: Supine:  SA reach 2 x 10; shoulder flexion/reach for lat stretch;  supine march with RTB and TA x 20;  Quadruped SA press 3 x 5 ;  Seated: Standing:  wall angels x 10;    shoulder flexion at wall 2 x 10;  Stretches:  Cat cow x 15, side/side x 5;  LTR x 15;  QL stretch 20 sec x 4  bil;  Neuromuscular Re-education: Manual Therapy: Therapeutic Activity: Self Care:   PATIENT EDUCATION:  Education details: updated and reviewed HEP Person educated: Patient Education method: Explanation, Demonstration, Tactile cues, Verbal cues, and Handouts Education comprehension: verbalized understanding, returned demonstration, verbal cues required, tactile cues required, and needs  further education   HOME EXERCISE PROGRAM: Access Code: [email or web address]    ASSESSMENT:  CLINICAL IMPRESSION: Pt with good ability for ther ex today. Feels stretch in lats with shoulder flexion in supine and at wall, but no pain in t-spine. Focus on thoracic mobility today, will progress to strength and stabilization as able.   Eval: Patient presents with primary complaint of increased pain in R thoracic region. She has minimal pain in ribs with palpation or testing today, but does have some reproduction of pain with thoracic PA s as well as thoracic extension and R SB. She has decreased strength of scapular and postural muscles as well as core. Pt to benefit from skilled PT to improve pain and ability for functional activity.   OBJECTIVE IMPAIRMENTS: decreased activity tolerance, decreased strength, increased muscle spasms, and pain.   ACTIVITY LIMITATIONS: carrying,  lifting, bending, standing, squatting, and locomotion level  PARTICIPATION LIMITATIONS: meal prep, cleaning, laundry, shopping, and community activity  PERSONAL FACTORS:  none  are also affecting patient's functional outcome.   REHAB POTENTIAL: Good  CLINICAL DECISION MAKING: Stable/uncomplicated  EVALUATION COMPLEXITY: Low   GOALS: Goals reviewed with patient? Yes  SHORT TERM GOALS: Target date: 01/17/2023  Pt to be independent with initial HEP  Goal status: INITIAL   LONG TERM GOALS: Target date: 01/17/2023   Pt to be independent with final HEP  Goal status: INITIAL  2.  Pt to report decreased pain in R thoracic region to 0-2/10 with standing activity, cooking and housework.   Goal status: INITIAL  3.  Pt to demo improved strength and stability of core and scapular muscles to be [facility] for pt age.   Goal status: INITIAL  4.  Pt  to demo ability for bend, lift, squat with mechanics WFL   Goal status: INITIAL   PLAN:  PT FREQUENCY: 1-2x/week  PT DURATION: 8 weeks  PLANNED INTERVENTIONS: Therapeutic exercises, Therapeutic activity, Neuromuscular re-education, Patient/Family education, Self Care, Joint mobilization, Joint manipulation, Stair training, Orthotic/Fit training, DME instructions, Aquatic Therapy, Dry Needling, Electrical stimulation, Cryotherapy, Moist heat, Taping, Ultrasound, Ionotophoresis 4mg /ml Dexamethasone, Manual therapy,  Vasopneumatic device, Traction, Spinal manipulation, Spinal mobilization,Balance training, Gait training,   PLAN FOR NEXT SESSION: postural, scapular and core strength, carries,  thoracic mobility, opening for R side.    Sedalia Muta, PT, DPT 8:43 AM  01/04/23

## 2023-01-08 NOTE — Telephone Encounter (Signed)
Per pt message 12/26/22:  Rodolph Bong, MD      12/29/22 12:34 PM Note I called and talked to [facility]  I think it is okay to hold off on osteoporosis medicines until her physical with Sam in November.  Would recommend Prolia but it is okay to wait a little bit.  I do not wanted to get forgotten about.

## 2023-01-12 ENCOUNTER — Encounter: Payer: Self-pay | Admitting: Physical Therapy

## 2023-01-12 ENCOUNTER — Ambulatory Visit (INDEPENDENT_AMBULATORY_CARE_PROVIDER_SITE_OTHER): Payer: Medicare Other | Admitting: Physical Therapy

## 2023-01-12 DIAGNOSIS — M546 Pain in thoracic spine: Secondary | ICD-10-CM

## 2023-01-12 NOTE — Therapy (Addendum)
OUTPATIENT PHYSICAL THERAPY THORACOLUMBAR TREATMENT   Patient Name: Tracey Moody MRN: [id number] DOB:Feb 14, 1956, 67 y.o., female Today's Date: 01/12/2023  END OF SESSION:  PT End of Session - 01/12/23 1226     Visit Number 3    Number of Visits 16    Date for PT Re-Evaluation 02/27/23    Authorization Type Medicare    PT Start Time 1230    PT Stop Time 1310    PT Time Calculation (min) 40 min    Activity Tolerance Patient tolerated treatment well    Behavior During Therapy [facility] for tasks assessed/performed              Past Medical History:  Diagnosis Date   Hyperlipidemia    Past Surgical History:  Procedure Laterality Date   APPENDECTOMY  59   CESAREAN SECTION  1979, 1980, 1983, 1984, 1987   TONSILLECTOMY  1960   Patient Active Problem List   Diagnosis Date Noted   Overweight 06/01/2020   Hyperlipidemia 06/01/2020   Bradycardia 06/01/2020   Osteoporosis 05/22/2018   Depression, major, single episode, mild (HCC) 02/15/2017   Gastritis 02/15/2017   Vitamin D deficiency 02/15/2017   Chronic pain of both knees 02/15/2017   Solar lentigo 02/15/2017    PCP: Jarold Motto   REFERRING PROVIDER: Clementeen Graham  REFERRING DIAG: R rib pain   Rationale for Evaluation and Treatment: Rehabilitation  THERAPY DIAG:  Acute right-sided thoracic back pain  ONSET DATE: 2 months ago  SUBJECTIVE:                                                                                                                                                                                           SUBJECTIVE STATEMENT: Pt states no pain in last week or so. Has been doing HEP    Eval: Pt states R side torso pain.  States she can do a lot of activity without pain and that the pain is inconsistent . Feels it at times when leaning, reaching  over, and with housework or standing/working in kitchen a lot.   Also has had low back pain in the past- not having any now. 2019- fell onto  butt, and had comp fracture?  States Some L sciatic issues from time to time, more At end of day when sitting or laying down to bed.  Has had neck issues with tingly hands, doing ok now.  Very active at home, has 5 grandchildren. Likes to walk- no back pain. Goes to [employer], t-mill or weight circuits.   PERTINENT HISTORY: Osteoporosis/penia   PAIN:  Are you having pain? Yes: NPRS scale: 0-1/10  Pain location: R lateral torso Pain description: sore Aggravating factors: turning, increased housework Relieving factors: none stated  PRECAUTIONS: None  WEIGHT BEARING RESTRICTIONS: No  FALLS:  Has patient fallen in last 6 months? No  PLOF: Independent  PATIENT GOALS: decreased pain   NEXT MD VISIT:   OBJECTIVE:   DIAGNOSTIC FINDINGS:   PATIENT SURVEYS:   COGNITION: Overall cognitive status: Within functional limits for tasks assessed     SENSATION:   POSTURE:  mild rounding of shoulders     LUMBAR ROM:   AROM eval  Flexion WFL  Extension WFL  Right lateral flexion WFL  feels stretch  Left lateral flexion WFL   feels stretch  Right rotation   Left rotation    (Blank rows = not tested)  LOWER EXTREMITY ROM:     Active  Right eval Left eval  Hip flexion    Hip extension    Hip abduction    Hip adduction    Hip internal rotation    Hip external rotation    Knee flexion    Knee extension    Ankle dorsiflexion    Ankle plantarflexion    Ankle inversion    Ankle eversion     (Blank rows = not tested)  LOWER EXTREMITY MMT:    UE: shoulders: 4+/5 ,  scapular: 4-/5   MMT Right eval Left eval  Hip flexion 4+ 4+  Hip extension    Hip abduction 4 4  Hip adduction    Hip internal rotation    Hip external rotation    Knee flexion 5 5  Knee extension 5 5  Ankle dorsiflexion    Ankle plantarflexion    Ankle inversion    Ankle eversion     (Blank rows = not tested)   TODAY'S TREATMENT:                                                                                                                               DATE:   01/12/23: Therapeutic Exercise: Aerobic: Supine:  SA reach 2 x 10; shoulder flexion/reach for lat stretch;  supine march with RTB and TA x 20;   bridging 2 x 10;  Seated:   Prone:  T's 2 x 10;  Standing:   shoulder flexion at wall  x 10;  Wall push ups x 15;  Scap squeeze x 10;  Rows Gtb 2 x 10;   Stretches:  seated Cat cow x 15,  LTR x 10;  supine Ue/Le reaches x 5 bil, 5 sec  Neuromuscular Re-education: Manual Therapy: Therapeutic Activity: Self Care:   01/04/23: Therapeutic Exercise: Aerobic: Supine:  SA reach 2 x 10; shoulder flexion/reach for lat stretch;  supine march with RTB and TA x 20;    Quadruped SA press 3 x 5 ;  Seated: Standing:  wall angels x 10;    shoulder flexion at wall 2 x 10;  Stretches:  Cat cow x  15, side/side x 5;  LTR x 15;  QL stretch 20 sec x 4  bil;  Neuromuscular Re-education: Manual Therapy: Therapeutic Activity: Self Care:   PATIENT EDUCATION:  Education details: updated and reviewed HEP Person educated: Patient Education method: Explanation, Demonstration, Tactile cues, Verbal cues, and Handouts Education comprehension: verbalized understanding, returned demonstration, verbal cues required, tactile cues required, and needs further education   HOME EXERCISE PROGRAM: Access Code: [email or web address]    ASSESSMENT:  CLINICAL IMPRESSION: Pt with good ability for ther ex today. She has no pain with thoracici motion today. She is doing well with HEP. Updated and reviewed HEP in detail. Discussed continuing mobility daily, and adding strengthening every other day. She is going away for about 1 month. May return when she gets back as needed. If she does not return, I will D/C. Pt in agreement with plan.   Eval: Patient presents with primary complaint of increased pain in R thoracic region. She has minimal pain in ribs with palpation or testing today, but does have some reproduction  of pain with thoracic PA s as well as thoracic extension and R SB. She has decreased strength of scapular and postural muscles as well as core. Pt to benefit from skilled PT to improve pain and ability for functional activity.   OBJECTIVE IMPAIRMENTS: decreased activity tolerance, decreased strength, increased muscle spasms, and pain.   ACTIVITY LIMITATIONS: carrying, lifting, bending, standing, squatting, and locomotion level  PARTICIPATION LIMITATIONS: meal prep, cleaning, laundry, shopping, and community activity  PERSONAL FACTORS:  none  are also affecting patient's functional outcome.   REHAB POTENTIAL: Good  CLINICAL DECISION MAKING: Stable/uncomplicated  EVALUATION COMPLEXITY: Low   GOALS: Goals reviewed with patient? Yes  SHORT TERM GOALS: Target date: 01/17/2023  Pt to be independent with initial HEP  Goal status: MET   LONG TERM GOALS: Target date: 01/17/2023   Pt to be independent with final HEP  Goal status: MET  2.  Pt to report decreased pain in R thoracic region to 0-2/10 with standing activity, cooking and housework.   Goal status: MET  3.  Pt to demo improved strength and stability of core and scapular muscles to be [facility] for pt age.   Goal status: IN PROGRESS/will continue with HEP  4.  Pt  to demo ability for bend, lift, squat with mechanics WFL   Goal status: MET   PLAN:  PT FREQUENCY: 1-2x/week  PT DURATION: 8 weeks  PLANNED INTERVENTIONS: Therapeutic exercises, Therapeutic activity, Neuromuscular re-education, Patient/Family education, Self Care, Joint mobilization, Joint manipulation, Stair training, Orthotic/Fit training, DME instructions, Aquatic Therapy, Dry Needling, Electrical stimulation, Cryotherapy, Moist heat, Taping, Ultrasound, Ionotophoresis 4mg /ml Dexamethasone, Manual therapy,  Vasopneumatic device, Traction, Spinal manipulation, Spinal mobilization,Balance training, Gait training,   PLAN FOR NEXT SESSION: postural, scapular and  core strength, carries,  thoracic mobility, opening for R side.    Sedalia Muta, PT, DPT 1:15 PM  01/12/23  PHYSICAL THERAPY DISCHARGE SUMMARY  Visits from Start of Care: 3   Plan: Patient agrees to discharge.  Patient goals were met. Patient is being discharged due to meeting the stated rehab goals.     Sedalia Muta, PT, DPT 4:36 PM  07/09/23

## 2023-01-17 ENCOUNTER — Encounter: Payer: Medicare Other | Admitting: Physical Therapy

## 2023-02-06 ENCOUNTER — Telehealth (INDEPENDENT_AMBULATORY_CARE_PROVIDER_SITE_OTHER): Payer: Medicare Other | Admitting: Family Medicine

## 2023-02-06 DIAGNOSIS — M816 Localized osteoporosis [Lequesne]: Secondary | ICD-10-CM

## 2023-02-06 DIAGNOSIS — R0781 Pleurodynia: Secondary | ICD-10-CM

## 2023-02-06 NOTE — Progress Notes (Signed)
    Virtual Visit  via Video Note   I connected with Tracey Moody  today by a video enabled telemedicine application and verified that I am speaking with the correct person using two identifiers.  ? Location of the provider office ? Location of the patient her daughter's home in [place] ? The names and roles of all persons participating in the visit.  Patient and myself   I discussed the limitations, risks, security and privacy concerns of performing an evaluation and management service by telephone and the availability of in person appointments. I also discussed with the patient that there may be a patient responsible charge related to this service. The patient expressed understanding and agreed to proceed.    I discussed the limitations of evaluation and management by telemedicine and the availability of in person appointments. The patient expressed understanding and agreed to proceed.  History of Present Illness: Tracey Moody is a 67 y.o. female who would like to discuss rib pain.  Previously seen for right-sided rib pain after a fall.  She has completed physical therapy and is feeling much better.  She will experience occasional pain with lifting but overall is feeling pretty good.  She is spending time with her daughter who has about 3 weeks left until she has her baby.  She has a 49-1/2-year-old toddler at home so it is definitely helping her daughter out.   Observations/Objective:   Exam: Appearance normal Normal Speech.    Assessment and Plan: 67 y.o. female with rib pain after fall.  Much improved.  Plan for continued home exercise program and check back as needed.  We could add PT again or get a new x-ray if needed.  Overall sounds like she is feeling a lot better.  We also talked about osteoporosis.  She is going to wait until her well adult visit later with her PCP to consider if she wants to take medicine.  Would recommend Prolia.  PDMP not reviewed  this encounter. No orders of the defined types were placed in this encounter.  No orders of the defined types were placed in this encounter.   Follow Up Instructions:    I discussed the assessment and treatment plan with the patient. The patient was provided an opportunity to ask questions and all were answered. The patient agreed with the plan and demonstrated an understanding of the instructions.   The patient was advised to call back or seek an in-person evaluation if the symptoms worsen or if the condition fails to improve as anticipated.  Time: Total encounter time 20 minutes including face-to-face time with the patient and, reviewing past medical record, and charting on the date of service.

## 2023-04-02 ENCOUNTER — Other Ambulatory Visit: Payer: Self-pay | Admitting: Physician Assistant

## 2023-06-20 ENCOUNTER — Telehealth: Payer: Self-pay | Admitting: Physician Assistant

## 2023-06-20 NOTE — Telephone Encounter (Signed)
Requesting labs prior to their annual visit on 07/19/23. Please place orders & we will call to schedule pt.

## 2023-06-20 NOTE — Telephone Encounter (Signed)
Please tell pt labs will be drawn day of physical, due to [employer] can not be drawn ahead of time.

## 2023-06-27 ENCOUNTER — Ambulatory Visit: Payer: Medicare Other | Admitting: Emergency Medicine

## 2023-06-27 VITALS — Ht 62.0 in | Wt 146.0 lb

## 2023-06-27 DIAGNOSIS — Z0000 Encounter for general adult medical examination without abnormal findings: Secondary | ICD-10-CM

## 2023-06-27 NOTE — Progress Notes (Signed)
Subjective:   Tracey Moody is a 67 y.o. female who presents for Medicare Annual (Subsequent) preventive examination.  Visit Complete: Virtual I connected with  Tracey Moody on 06/27/23 by a audio enabled telemedicine application and verified that I am speaking with the correct person using two identifiers.  Patient Location: Other:  Son's home in [place]  Provider Location: Home Office  I discussed the limitations of evaluation and management by telemedicine. The patient expressed understanding and agreed to proceed.  Vital Signs: Because this visit was a virtual/telehealth visit, some criteria may be missing or patient reported. Any vitals not documented were not able to be obtained and vitals that have been documented are patient reported.  Patient Medicare AWV questionnaire was completed by the patient on 06/27/23; I have confirmed that all information answered by patient is correct and no changes since this date.  Cardiac Risk Factors include: advanced age (>23men, >77 women);dyslipidemia     Objective:    Today's Vitals   06/27/23 0901  Weight: 146 lb (66.2 kg)  Height: 5\' 2"  (1.575 m)   Body mass index is 26.7 kg/m.     06/27/2023    9:13 AM 01/03/2023   10:43 AM 06/20/2022   10:16 AM 06/07/2021   10:12 AM  Advanced Directives  Does Patient Have a Medical Advance Directive? Yes No Yes Yes  Type of Advance Directive Living will;Healthcare Power of [employer] Power of [place];Living will   Does patient want to make changes to medical advance directive? No - Patient declined   Yes (MAU/Ambulatory/Procedural Areas - Information given)  Copy of Healthcare Power of Attorney in Chart? No - copy requested  No - copy requested   Would patient like information on creating a medical advance directive?  No - Patient declined      Current Medications (verified) Outpatient Encounter Medications as of 06/27/2023  Medication Sig   aspirin EC 81 MG  tablet Take 81 mg by mouth daily.   atorvastatin (LIPITOR) 40 MG tablet TAKE 1 TABLET BY MOUTH EVERY DAY   b complex vitamins tablet Take 1 tablet by mouth daily.   Baclofen 5 MG TABS TAKE 1 TABLET BY MOUTH DAILY AS NEEDED.   BIOTIN PO Take 1 tablet by mouth daily.   Calcium Citrate (CITRACAL PO) Take 1,200 mg by mouth daily.    Cholecalciferol (VITAMIN D3) 2000 units TABS Take 1 tablet by mouth daily.   Echinacea 500 MG CAPS Take 2 capsules by mouth daily.   famotidine (PEPCID) 20 MG tablet TAKE 1 TABLET DAILY   naproxen sodium (ANAPROX) 220 MG tablet Take 220 mg by mouth as needed.   Omega-3 Fatty Acids (FISH OIL) 1000 MG CPDR Take 1 capsule by mouth daily.   Turmeric 500 MG CAPS Take 1 capsule by mouth daily.   alendronate (FOSAMAX) 70 MG tablet Take 1 tablet (70 mg total) by mouth every 7 (seven) days. Take with a full glass of water on an empty stomach. (Patient not taking: Reported on 06/27/2023)   No facility-administered encounter medications on file as of 06/27/2023.    Allergies (verified) Patient has no known allergies.   History: Past Medical History:  Diagnosis Date   Hyperlipidemia    Past Surgical History:  Procedure Laterality Date   APPENDECTOMY  28   CESAREAN SECTION  1979, 1980, 1983, 1984, 1987   TONSILLECTOMY  66   Family History  Problem Relation Age of Onset   Lung cancer Mother  Osteoporosis Mother    CAD Mother    Skin cancer Father    Hypertension Father    Hypercholesterolemia Sister    Heart attack Sister    Hypercholesterolemia Sister    Hypercholesterolemia Brother    Hypercholesterolemia Brother    CVA Maternal Grandmother    Alzheimer's disease Maternal Grandmother    Breast cancer Neg Hx    Colon cancer Neg Hx    Colon polyps Neg Hx    Esophageal cancer Neg Hx    Stomach cancer Neg Hx    Rectal cancer Neg Hx    Social History   Socioeconomic History   Marital status: Married    Spouse name: Nadine Counts   Number of children: 5    Years of education: Not on file   Highest education level: Bachelor's degree (e.g., BA, AB, BS)  Occupational History   Occupation: retired  Tobacco Use   Smoking status: Never   Smokeless tobacco: Never  Vaping Use   Vaping status: Never Used  Substance and Sexual Activity   Alcohol use: Yes    Comment: 1 beer every 2-3 months   Drug use: No   Sexual activity: Yes    Birth control/protection: Other-see comments    Comment: Husband Vasectomy  Other Topics Concern   Not on file  Social History Narrative   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   Moved from [facility] with her husband to be near her daughter and 4 kids   Husband does not work   Chemical engineer Strain: Low Risk  (06/27/2023)   Overall Financial Resource Strain (CARDIA)    Difficulty of Paying Living Expenses: Not hard at all  Food Insecurity: No Food Insecurity (06/27/2023)   Hunger Vital Sign    Worried About Running Out of Food in the Last Year: Never true    Ran Out of Food in the Last Year: Never true  Transportation Needs: No Transportation Needs (06/27/2023)   PRAPARE - Administrator, Civil Service (Medical): No    Lack of Transportation (Non-Medical): No  Physical Activity: Sufficiently Active (06/27/2023)   Exercise Vital Sign    Days of Exercise per Week: 5 days    Minutes of Exercise per Session: 40 min  Stress: No Stress Concern Present (06/27/2023)   [employer] of Occupational Health - Occupational Stress Questionnaire    Feeling of Stress : Not at all  Social Connections: Moderately Integrated (06/27/2023)   Social Connection and Isolation Panel [NHANES]    Frequency of Communication with Friends and Family: More than three times a week    Frequency of Social Gatherings with Friends and Family: More than three times a week    Attends Religious Services: More than 4 times per year    Active Member of [employer] or Organizations: No    Attends Museum/gallery exhibitions officer: Never    Marital Status: Married    Tobacco Counseling Counseling given: Not Answered   Clinical Intake:  Pre-visit preparation completed: Yes  Pain : No/denies pain     BMI - recorded: 26.7 Nutritional Status: BMI 25 -29 Overweight Nutritional Risks: None Diabetes: No  How often do you need to have someone help you when you read instructions, pamphlets, or other written materials from your doctor or pharmacy?: 1 - Never  Interpreter Needed?: No  Information entered by :: Tora Kindred, CMA   Activities of Daily Living    06/27/2023    8:42 AM  In your present state of health, do you have any difficulty performing the following activities:  Hearing? 0  Vision? 0  Difficulty concentrating or making decisions? 0  Walking or climbing stairs? 0  Dressing or bathing? 0  Doing errands, shopping? 0  Preparing Food and eating ? N  Using the Toilet? N  In the past six months, have you accidently leaked urine? N  Do you have problems with loss of bowel control? N  Managing your Medications? N  Managing your Finances? N  Housekeeping or managing your Housekeeping? N    Patient Care Team: Jarold Motto, [place] as PCP - General (Physician Assistant) Ob/Gyn, [facility] as Consulting Physician (Obstetrics and Gynecology)  Indicate any recent Medical Services you may have received from other than [facility] providers in the past year (date may be approximate).     Assessment:   This is a routine wellness examination for Tracey Moody.  Hearing/Vision screen Hearing Screening - Comments:: Denies hearing loss Vision Screening - Comments:: Gets eye exams   Goals Addressed               This Visit's Progress     DIET - INCREASE WATER INTAKE (pt-stated)        Keep eating healthy and maintain health      Depression Screen    06/27/2023    9:09 AM 07/17/2022   10:19 AM 06/20/2022   10:14 AM 07/13/2021   10:31 AM 06/07/2021   10:11 AM 06/01/2020    10:36 AM 05/09/2019    1:20 PM  PHQ 2/9 Scores  PHQ - 2 Score 0 0 0 0 0 0 0  PHQ- 9 Score 0     3 2    Fall Risk    06/27/2023    8:42 AM 06/20/2022   10:17 AM 06/16/2022    2:30 PM 07/13/2021   10:36 AM 06/07/2021   10:13 AM  Fall Risk   Falls in the past year? 0 0 0 0 0  Number falls in past yr: 0 0 0 0 0  Injury with Fall? 0 0 0 0 0  Risk for fall due to : No Fall Risks Impaired vision   Impaired vision  Follow up Falls prevention discussed Falls prevention discussed  Falls evaluation completed Falls prevention discussed    MEDICARE RISK AT HOME: Medicare Risk at Home Any stairs in or around the home?: Yes If so, are there any without handrails?: No Home free of loose throw rugs in walkways, pet beds, electrical cords, etc?: Yes Adequate lighting in your home to reduce risk of falls?: Yes Life alert?: No Use of a cane, walker or w/c?: No Grab bars in the bathroom?: No Shower chair or bench in shower?: No Elevated toilet seat or a handicapped toilet?: No  TIMED UP AND GO:  Was the test performed?  No    Cognitive Function:    06/01/2020   10:37 AM  MMSE - Mini Mental State Exam  Orientation to time 5  Orientation to Place 5  Registration 3  Attention/ Calculation 5  Recall 3  Language- name 2 objects 2  Language- repeat 1  Language- follow 3 step command 3  Language- read & follow direction 1        06/27/2023    9:15 AM 06/20/2022   10:19 AM 06/07/2021   10:14 AM  6CIT Screen  What Year? 0 points 0 points 0 points  What month? 0 points 0 points 0 points  What time? 0 points 0 points 0 points  Count back from 20 0 points 0 points 0 points  Months in reverse 0 points 0 points 0 points  Repeat phrase 0 points 0 points 0 points  Total Score 0 points 0 points 0 points    Immunizations Immunization History  Administered Date(s) Administered   Influenza, High Dose Seasonal PF 04/07/2021, 04/22/2022   Influenza,inj,Quad PF,6+ Mos 05/01/2017,  05/06/2018, 05/16/2020   Influenza-Unspecified 04/29/2019   PFIZER(Purple Top)SARS-COV-2 Vaccination 09/11/2019, 10/10/2019   PNEUMOCOCCAL CONJUGATE-20 07/13/2021   Tdap 09/18/2013   Zoster Recombinant(Shingrix) 06/26/2018, 08/27/2018    TDAP status: Up to date  Flu Vaccine status: Up to date  Pneumococcal vaccine status: Up to date  Covid-19 vaccine status: Declined, Education has been provided regarding the importance of this vaccine but patient still declined. Advised may receive this vaccine at local pharmacy or Health Dept.or vaccine clinic. Aware to provide a copy of the vaccination record if obtained from local pharmacy or Health Dept. Verbalized acceptance and understanding.  Qualifies for Shingles Vaccine? Yes   Zostavax completed No   Shingrix Completed?: Yes  Screening Tests Health Maintenance  Topic Date Due   Colonoscopy  09/22/2013   INFLUENZA VACCINE  03/01/2023   MAMMOGRAM  07/13/2023   DTaP/Tdap/Td (2 - Td or Tdap) 09/19/2023   Medicare Annual Wellness (AWV)  06/26/2024   DEXA SCAN  07/17/2024   Pneumonia Vaccine 29+ Years old  Completed   Hepatitis C Screening  Completed   Zoster Vaccines- Shingrix  Completed   HPV VACCINES  Aged Out   COVID-19 Vaccine  Discontinued    Health Maintenance  Health Maintenance Due  Topic Date Due   Colonoscopy  09/22/2013   INFLUENZA VACCINE  03/01/2023    Colorectal cancer screening: Type of screening: Colonoscopy. Completed 09/22/08. Repeat every 5 years  Mammogram status: Completed 07/12/22. Repeat every year  Bone Density status: Completed 07/17/22. Results reflect: Bone density results: OSTEOPENIA. Repeat every 2 years.  Lung Cancer Screening: (Low Dose CT Chest recommended if Age 44-80 years, 20 pack-year currently smoking OR have quit w/in 15years.) does not qualify.   Lung Cancer Screening Referral: n/a  Additional Screening:  Hepatitis C Screening: does not qualify; Completed 03/01/17  Vision Screening:  Recommended annual ophthalmology exams for early detection of glaucoma and other disorders of the eye.  Dental Screening: Recommended annual dental exams for proper oral hygiene   Community Resource Referral / Chronic Care Management: CRR required this visit?  No   CCM required this visit?  No     Plan:     I have personally reviewed and noted the following in the patient's chart:   Medical and social history Use of alcohol, tobacco or illicit drugs  Current medications and supplements including opioid prescriptions. Patient is not currently taking opioid prescriptions. Functional ability and status Nutritional status Physical activity Advanced directives List of other physicians Hospitalizations, surgeries, and ER visits in previous 12 months Vitals Screenings to include cognitive, depression, and falls Referrals and appointments  In addition, I have reviewed and discussed with patient certain preventive protocols, quality metrics, and best practice recommendations. A written personalized care plan for preventive services as well as general preventive health recommendations were provided to patient.     Tora Kindred, CMA   06/27/2023   After Visit Summary: (MyChart) Due to this being a telephonic visit, the after visit summary with patients personalized plan was offered to patient via MyChart   Nurse Notes:  MMG scheduled week of 07/15/23 Colonoscopy due 09/2023 Tdap due 09/2023 Declined covid vaccine

## 2023-06-27 NOTE — Patient Instructions (Addendum)
Tracey Moody , Thank you for taking time to come for your Medicare Wellness Visit. I appreciate your ongoing commitment to your health goals. Please review the following plan we discussed and let me know if I can assist you in the future.   Referrals/Orders/Follow-Ups/Clinician Recommendations: You are due for a colonoscopy in February 2025. You are due for a tetanus shot in February 2025. Keep up the great work!!  This is a list of the screening recommended for you and due dates:  Health Maintenance  Topic Date Due   Colon Cancer Screening  09/22/2013   Mammogram  07/13/2023   DTaP/Tdap/Td vaccine (2 - Td or Tdap) 09/19/2023   Medicare Annual Wellness Visit  06/26/2024   DEXA scan (bone density measurement)  07/17/2024   Pneumonia Vaccine  Completed   Flu Shot  Completed   Hepatitis C Screening  Completed   Zoster (Shingles) Vaccine  Completed   HPV Vaccine  Aged Out   COVID-19 Vaccine  Discontinued    Advanced directives: (Copy Requested) Please bring a copy of your health care power of attorney and living will to the office to be added to your chart at your convenience.  Next Medicare Annual Wellness Visit scheduled for next year: Yes, 07/02/24 @ 8:40am

## 2023-07-11 ENCOUNTER — Other Ambulatory Visit: Payer: Self-pay | Admitting: Physician Assistant

## 2023-07-18 ENCOUNTER — Encounter: Payer: Self-pay | Admitting: Physician Assistant

## 2023-07-18 LAB — HM MAMMOGRAPHY

## 2023-07-19 ENCOUNTER — Encounter: Payer: Self-pay | Admitting: Physician Assistant

## 2023-07-19 ENCOUNTER — Ambulatory Visit: Payer: Medicare Other | Admitting: Physician Assistant

## 2023-07-19 VITALS — BP 105/59 | HR 65 | Temp 97.5°F | Ht 62.0 in | Wt 145.4 lb

## 2023-07-19 DIAGNOSIS — E559 Vitamin D deficiency, unspecified: Secondary | ICD-10-CM

## 2023-07-19 DIAGNOSIS — Z1211 Encounter for screening for malignant neoplasm of colon: Secondary | ICD-10-CM

## 2023-07-19 DIAGNOSIS — E785 Hyperlipidemia, unspecified: Secondary | ICD-10-CM | POA: Diagnosis not present

## 2023-07-19 DIAGNOSIS — R42 Dizziness and giddiness: Secondary | ICD-10-CM

## 2023-07-19 DIAGNOSIS — Z0000 Encounter for general adult medical examination without abnormal findings: Secondary | ICD-10-CM

## 2023-07-19 DIAGNOSIS — E663 Overweight: Secondary | ICD-10-CM

## 2023-07-19 DIAGNOSIS — M816 Localized osteoporosis [Lequesne]: Secondary | ICD-10-CM

## 2023-07-19 LAB — CBC WITH DIFFERENTIAL/PLATELET
Basophils Absolute: 0 10*3/uL (ref 0.0–0.1)
Basophils Relative: 0.9 % (ref 0.0–3.0)
Eosinophils Absolute: 0.3 10*3/uL (ref 0.0–0.7)
Eosinophils Relative: 5.8 % — ABNORMAL HIGH (ref 0.0–5.0)
HCT: 40 % (ref 36.0–46.0)
Hemoglobin: 13.6 g/dL (ref 12.0–15.0)
Lymphocytes Relative: 36.6 % (ref 12.0–46.0)
Lymphs Abs: 1.9 10*3/uL (ref 0.7–4.0)
MCHC: 34 g/dL (ref 30.0–36.0)
MCV: 93.5 fL (ref 78.0–100.0)
Monocytes Absolute: 0.3 10*3/uL (ref 0.1–1.0)
Monocytes Relative: 5.1 % (ref 3.0–12.0)
Neutro Abs: 2.7 10*3/uL (ref 1.4–7.7)
Neutrophils Relative %: 51.6 % (ref 43.0–77.0)
Platelets: 219 10*3/uL (ref 150.0–400.0)
RBC: 4.28 Mil/uL (ref 3.87–5.11)
RDW: 13.8 % (ref 11.5–15.5)
WBC: 5.2 10*3/uL (ref 4.0–10.5)

## 2023-07-19 LAB — COMPREHENSIVE METABOLIC PANEL
ALT: 17 U/L (ref 0–35)
AST: 20 U/L (ref 0–37)
Albumin: 4.5 g/dL (ref 3.5–5.2)
Alkaline Phosphatase: 58 U/L (ref 39–117)
BUN: 16 mg/dL (ref 6–23)
CO2: 27 meq/L (ref 19–32)
Calcium: 9.2 mg/dL (ref 8.4–10.5)
Chloride: 106 meq/L (ref 96–112)
Creatinine, Ser: 0.59 mg/dL (ref 0.40–1.20)
GFR: 92.82 mL/min (ref >60.00)
Glucose, Bld: 87 mg/dL (ref 70–99)
Potassium: 3.8 meq/L (ref 3.5–5.1)
Sodium: 141 meq/L (ref 135–145)
Total Bilirubin: 0.8 mg/dL (ref 0.2–1.2)
Total Protein: 6.9 g/dL (ref 6.0–8.3)

## 2023-07-19 LAB — LIPID PANEL
Cholesterol: 198 mg/dL (ref 0–200)
HDL: 77.9 mg/dL (ref >39.00)
LDL Cholesterol: 105 mg/dL — ABNORMAL HIGH (ref 0–99)
NonHDL: 119.68
Total CHOL/HDL Ratio: 3
Triglycerides: 72 mg/dL (ref 0.0–149.0)
VLDL: 14.4 mg/dL (ref 0.0–40.0)

## 2023-07-19 LAB — VITAMIN D 25 HYDROXY (VIT D DEFICIENCY, FRACTURES): VITD: 44.73 ng/mL (ref 30.00–100.00)

## 2023-07-19 NOTE — Progress Notes (Signed)
Vitamin D and calcium and metabolic panel are normal. Please let me know what you would like to do with osteoporosis.  We talked about Prolia back in July.

## 2023-07-19 NOTE — Patient Instructions (Signed)
It was great to see you! ? ?Please go to the lab for blood work.  ? ?Our office will call you with your results unless you have chosen to receive results via MyChart. ? ?If your blood work is normal we will follow-up each year for physicals and as scheduled for chronic medical problems. ? ?If anything is abnormal we will treat accordingly and get you in for a follow-up. ? ?Take care, ? ?Jerrin Recore ?  ? ? ?

## 2023-07-19 NOTE — Progress Notes (Signed)
Subjective:    Tracey Moody is a 67 y.o. female and is here for a comprehensive physical exam.  HPI  Health Maintenance Due  Topic Date Due   Colonoscopy  09/22/2013    Acute Concerns: Lightheadedness: Reports sporadic episodes of off balance on a daily basis.  States she always leans to the left when this occurs.  Tends to occur when she looks down when sitting down and with quick positional changes.  Monitors her BP at home, reports readings averaging in 110s/high-60s Has cut caffeine in half and started eating breakfast Not drinking recommended amount of water daily.  Does not add extra salt on foods.  Chronic Issues: Osteoporosis: Stopped taking Fosamax since seeing Dr. Denyse Amass of sports med, last dose was on 12/26/22. Reports previous back pain and was taking Advil nightly to relieve pain. States her back pain resolved after stopping fosamax.  Currently taking calcium and vitamin D supplementation.  Has been going to the gym to walk, but not doing weight bearing exercises.  Is hesitant to start prolia due to possible side effects similar to fosamax.   HLD: Complaint with Atorvastatin 40 mg daily. Strong family history of HLD (siblings), CVAs (maternal grandmother), and heart attacks (sister).   Health Maintenance: Immunizations -- UTD Colonoscopy -- Overdue, last done 09/22/08. Recommended repeat was 5 years. Pt reports she had to cancel procedure when it was due. Has not rescheduled due to no GI symptoms. Mammogram -- UTD, last done 07/18/23. PAP -- Last done 04/29/2018. Aged out.  Bone Density -- Last done 07/17/22. Results revealed osteopenia. Recommended repeat every 2 years.  Diet -- Has started eating breakfast, which she previously would skip. Has cut coffee intake.  Exercise -- Walks at the gym.   Sleep habits -- No current concerns.  Mood -- Stable  UTD with dentist? - yes UTD with eye doctor? - yes Following up with derm? Yes, recently followed up  in October.   Weight history: Wt Readings from Last 10 Encounters:  07/19/23 145 lb 6.1 oz (65.9 kg)  06/27/23 146 lb (66.2 kg)  12/26/22 148 lb 12.8 oz (67.5 kg)  12/19/22 149 lb (67.6 kg)  07/17/22 147 lb 6.1 oz (66.9 kg)  06/20/22 147 lb (66.7 kg)  12/13/21 147 lb (66.7 kg)  07/13/21 155 lb 4 oz (70.4 kg)  08/03/20 154 lb (69.9 kg)  06/01/20 154 lb (69.9 kg)   Body mass index is 26.59 kg/m. No LMP recorded. Patient is postmenopausal.  Alcohol use:  reports current alcohol use.  Tobacco use:  Tobacco Use: Low Risk  (07/19/2023)   Patient History    Smoking Tobacco Use: Never    Smokeless Tobacco Use: Never    Passive Exposure: Not on file   Eligible for lung cancer screening? no     07/19/2023   10:26 AM  Depression screen PHQ 2/9  Decreased Interest 0  Down, Depressed, Hopeless 0  PHQ - 2 Score 0  Altered sleeping 1  Tired, decreased energy 1  Change in appetite 0  Feeling bad or failure about yourself  0  Trouble concentrating 0  Moving slowly or fidgety/restless 0  Suicidal thoughts 0  PHQ-9 Score 2  Difficult doing work/chores Not difficult at all     Other providers/specialists: Patient Care Team: Jarold Motto, [place] as PCP - General (Physician Assistant) Ob/Gyn, [facility] as Consulting Physician (Obstetrics and Gynecology) Rodolph Bong, MD as Consulting Physician (Sports Medicine)    PMHx, SurgHx, SocialHx, Medications,  and Allergies were reviewed in the Visit Navigator and updated as appropriate.   Past Medical History:  Diagnosis Date   Hyperlipidemia      Past Surgical History:  Procedure Laterality Date   APPENDECTOMY  10   CESAREAN SECTION  1979, 1980, 1983, 1984, 1987   TONSILLECTOMY  1960     Family History  Problem Relation Age of Onset   Lung cancer Mother 30   Osteoporosis Mother    CAD Mother    Skin cancer Father    Hypertension Father    Hypercholesterolemia Sister    Uterine cancer Sister    Heart attack  Sister    Hypercholesterolemia Sister    COPD Sister    Hypercholesterolemia Sister    Hypercholesterolemia Sister    Hypercholesterolemia Brother    Hypercholesterolemia Brother    Hypercholesterolemia Brother    Other Brother 44       MVA   CVA Maternal Grandmother    Alzheimer's disease Maternal Grandmother    Breast cancer Neg Hx    Colon cancer Neg Hx    Colon polyps Neg Hx    Esophageal cancer Neg Hx    Stomach cancer Neg Hx    Rectal cancer Neg Hx     Social History   Tobacco Use   Smoking status: Never   Smokeless tobacco: Never  Vaping Use   Vaping status: Never Used  Substance Use Topics   Alcohol use: Yes    Comment: 1 beer every 2-3 months   Drug use: No    Review of Systems:   Review of Systems  Constitutional:  Negative for chills, fever, malaise/fatigue and weight loss.  HENT:  Negative for hearing loss, sinus pain and sore throat.   Respiratory:  Negative for cough and hemoptysis.   Cardiovascular:  Negative for chest pain, palpitations, leg swelling and PND.  Gastrointestinal:  Negative for abdominal pain, constipation, diarrhea, heartburn, nausea and vomiting.  Genitourinary:  Negative for dysuria, frequency and urgency.  Musculoskeletal:  Negative for back pain, myalgias and neck pain.  Skin:  Negative for itching and rash.  Neurological:  Negative for dizziness, tingling, seizures and headaches.  Endo/Heme/Allergies:  Negative for polydipsia.  Psychiatric/Behavioral:  Negative for depression. The patient is not nervous/anxious.     Objective:   BP (!) 105/59 (BP Location: Left Arm, Patient Position: Sitting, Cuff Size: Normal)   Pulse 65   Temp (!) 97.5 F (36.4 C) (Temporal)   Ht 5\' 2"  (1.575 m)   Wt 145 lb 6.1 oz (65.9 kg)   SpO2 98%   BMI 26.59 kg/m  Body mass index is 26.59 kg/m.   General Appearance:    Alert, cooperative, no distress, appears stated age  Head:    Normocephalic, without obvious abnormality, atraumatic  Eyes:     PERRL, conjunctiva/corneas clear, EOM's intact, fundi    benign, both eyes  Ears:    Normal TM's and external ear canals, both ears  Nose:   Nares normal, septum midline, mucosa normal, no drainage    or sinus tenderness  Throat:   Lips, mucosa, and tongue normal; teeth and gums normal  Neck:   Supple, symmetrical, trachea midline, no adenopathy;    thyroid:  no enlargement/tenderness/nodules; no carotid   bruit or JVD  Back:     Symmetric, no curvature, ROM normal, no CVA tenderness  Lungs:     Clear to auscultation bilaterally, respirations unlabored  Chest Wall:    No tenderness or deformity  Heart:    Regular rate and rhythm, S1 and S2 normal, no murmur, rub or gallop  Breast Exam:    Deferred  Abdomen:     Soft, non-tender, bowel sounds active all four quadrants,    no masses, no organomegaly  Genitalia:    Deferred  Extremities:   Extremities normal, atraumatic, no cyanosis or edema  Pulses:   2+ and symmetric all extremities  Skin:   Skin color, texture, turgor normal, no rashes or lesions  Lymph nodes:   Cervical, supraclavicular, and axillary nodes normal  Neurologic:   CNII-XII intact, normal strength, sensation and reflexes    throughout    Assessment/Plan:   Routine physical examination Today patient counseled on age appropriate routine health concerns for screening and prevention, each reviewed and up to date or declined. Immunizations reviewed and up to date or declined. Labs ordered and reviewed. Risk factors for depression reviewed and negative. Hearing function and visual acuity are intact. ADLs screened and addressed as needed. Functional ability and level of safety reviewed and appropriate. Education, counseling and referrals performed based on assessed risks today. Patient provided with a copy of personalized plan for preventive services.  Overweight Continue efforts at healthy eating  Localized osteoporosis without current pathological fracture Reviewed most  recent DEXA After shared decision making, do feel like its very reasonable to hold medication at this time and recheck DEXA next year and re-evaluate medication  Vitamin D deficiency Update vitamin D and provide recommendations  Hyperlipidemia, unspecified hyperlipidemia type Update lipid panel and adjust Lipitor 40 mg daily  Lightheadedness No red flags on exam Suspect orthostatic hypotension due to dehydration Push fluids, consider compression stockings Follow-up if symptom(s) persist despite increased fluids  Special screening for malignant neoplasms, colon Encouraged her to get this done as she is currently 9 years past due  I, Isabelle Course, acting as a Neurosurgeon for [employer], [place]., have documented all relevant documentation on the behalf of Jarold Motto, [place], as directed by  Jarold Motto, PA while in the presence of Jarold Motto, [place].  I, Isabelle Course, have reviewed all documentation for this visit. The documentation on 07/19/23 for the exam, diagnosis, procedures, and orders are all accurate and complete.  Jarold Motto, PA-C [facility]

## 2023-07-20 ENCOUNTER — Encounter: Payer: Self-pay | Admitting: Physician Assistant

## 2023-07-20 MED ORDER — ATORVASTATIN CALCIUM 80 MG PO TABS: 80.0000 mg | ORAL_TABLET | Freq: Every day | ORAL | 3 refills | Status: DC

## 2023-07-23 NOTE — Telephone Encounter (Signed)
Per results notes 07/19/23:   Rodolph Bong, MD 07/19/2023  4:09 PM EST     Vitamin D and calcium and metabolic panel are normal. Please let me know what you would like to do with osteoporosis.  We talked about Prolia back in July.

## 2023-07-23 NOTE — Telephone Encounter (Signed)
Per pt messages (has been sent to Dr. Denyse Amass to review and advise):  Tracey Moody to Tracey Moody  RR    07/20/23  8:23 AM Good morning Tracey Moody  I had my physical with Tracey Moody yesterday and we discussed the issue of my starting prolia.  Since stopping the fosamax in May,  all of my low back pain has gone away. Coincidence? Or was that a side effect of the fosamax? Anyway, we agreed that maybe it would be ok to defer taking the Prolia for another year at which time I will have a repeat Dexa scan.  I would like to know what Dr Denyse Amass thinks about this decision.  Thank you Tracey Moody

## 2023-07-30 NOTE — Telephone Encounter (Signed)
I understand your reluctance to start a new medicine.  I think it is unlikely you are going to have side effects from Prolia.  If you want to wait a year that is okay.

## 2023-07-31 NOTE — Telephone Encounter (Signed)
Pt archived in [email or web address].  Please advise if patient and/or provider wish to proceed with Prolia therpay.  

## 2023-08-08 ENCOUNTER — Other Ambulatory Visit: Payer: Self-pay | Admitting: Physician Assistant

## 2023-10-23 ENCOUNTER — Encounter: Payer: Self-pay | Admitting: Physician Assistant

## 2023-10-24 MED ORDER — ATORVASTATIN CALCIUM 80 MG PO TABS: 80.0000 mg | ORAL_TABLET | Freq: Every day | ORAL | 3 refills | Status: AC

## 2023-11-08 ENCOUNTER — Ambulatory Visit (INDEPENDENT_AMBULATORY_CARE_PROVIDER_SITE_OTHER): Admitting: Physician Assistant

## 2023-11-08 ENCOUNTER — Encounter: Payer: Self-pay | Admitting: Physician Assistant

## 2023-11-08 VITALS — BP 108/70 | HR 69 | Temp 97.4°F | Ht 62.0 in | Wt 146.2 lb

## 2023-11-08 DIAGNOSIS — M79641 Pain in right hand: Secondary | ICD-10-CM

## 2023-11-08 NOTE — Patient Instructions (Signed)
A referral has been placed for you to see one of our fantastic providers at [facility]. Someone from their office will be in touch soon regarding scheduling your appointment.  Their location:  [facility] at [facility]  [address] on the 1st floor Phone number [phone] Fax [phone].   This location is across the street from the entrance to [employer] and in the same complex as the [facility]

## 2023-11-08 NOTE — Progress Notes (Signed)
 Tracey Moody is a 68 y.o. female here for a new problem.  History of Present Illness:   Chief Complaint  Patient presents with   Hand Pain    Pt states has right hand pain has been going on since December. Hand swelling hard to hold things now. States is getting worse since December.    HPI  Right hand pain: Pt complains of right hand pain and swelling, ongoing since December.   She reports her 4th and 5th digit on her right hand tend to get stuck and her 3rd digit is unable to bend due to swelling in the mornings.  Additionally, her pain radiates through her palm and occasionally to her wrist.  She is taking Aleve once nightly as well as Voltaren cream which has worsened her symptoms.  She has also tried deep tissue massages and hand exercises recommended to her by her daughter who is a physical therapist. Denies any reduced range of motion.  Has not tried any hand splints.   Past Medical History:  Diagnosis Date   Hyperlipidemia      Social History   Tobacco Use   Smoking status: Never   Smokeless tobacco: Never  Vaping Use   Vaping status: Never Used  Substance Use Topics   Alcohol use: Yes    Comment: 1 beer every 2-3 months   Drug use: No    Past Surgical History:  Procedure Laterality Date   APPENDECTOMY  49   CESAREAN SECTION  1979, 1980, 1983, 1984, 1987   TONSILLECTOMY  1960    Family History  Problem Relation Age of Onset   Lung cancer Mother 76   Osteoporosis Mother    CAD Mother    Skin cancer Father    Hypertension Father    Hypercholesterolemia Sister    Uterine cancer Sister    Heart attack Sister    Hypercholesterolemia Sister    COPD Sister    Hypercholesterolemia Sister    Hypercholesterolemia Sister    Hypercholesterolemia Brother    Hypercholesterolemia Brother    Hypercholesterolemia Brother    Other Brother 55       MVA   CVA Maternal Grandmother    Alzheimer's disease Maternal Grandmother    Breast cancer Neg Hx     Colon cancer Neg Hx    Colon polyps Neg Hx    Esophageal cancer Neg Hx    Stomach cancer Neg Hx    Rectal cancer Neg Hx     No Known Allergies  Current Medications:   Current Outpatient Medications:    aspirin EC 81 MG tablet, Take 81 mg by mouth daily., Disp: , Rfl:    atorvastatin (LIPITOR) 80 MG tablet, Take 1 tablet (80 mg total) by mouth daily., Disp: 90 tablet, Rfl: 3   b complex vitamins tablet, Take 1 tablet by mouth daily., Disp: , Rfl:    Baclofen 5 MG TABS, TAKE 1 TABLET BY MOUTH DAILY AS NEEDED., Disp: 90 tablet, Rfl: 0   BIOTIN PO, Take 1 tablet by mouth daily., Disp: , Rfl:    Calcium Citrate (CITRACAL PO), Take 1,200 mg by mouth daily. , Disp: , Rfl:    Cholecalciferol (VITAMIN D3) 2000 units TABS, Take 1 tablet by mouth daily., Disp: , Rfl:    Echinacea 500 MG CAPS, Take 2 capsules by mouth daily., Disp: , Rfl:    famotidine (PEPCID) 20 MG tablet, TAKE 1 TABLET DAILY, Disp: 90 tablet, Rfl: 1   naproxen sodium (ANAPROX) 220  MG tablet, Take 220 mg by mouth as needed., Disp: , Rfl:    Omega-3 Fatty Acids (FISH OIL) 1000 MG CPDR, Take 1 capsule by mouth daily., Disp: , Rfl:    Turmeric 500 MG CAPS, Take 1 capsule by mouth daily., Disp: , Rfl:    Review of Systems:   Negative unless otherwise specified per HPI.  Vitals:   Vitals:   11/08/23 1110  BP: 108/70  Pulse: 69  Temp: (!) 97.4 F (36.3 C)  TempSrc: Temporal  SpO2: 98%  Weight: 146 lb 3.2 oz (66.3 kg)  Height: 5\' 2"  (1.575 m)     Body mass index is 26.74 kg/m.  Physical Exam:   Physical Exam Constitutional:      Appearance: Normal appearance. She is well-developed.  HENT:     Head: Normocephalic and atraumatic.  Eyes:     General: Lids are normal.     Extraocular Movements: Extraocular movements intact.     Conjunctiva/sclera: Conjunctivae normal.  Pulmonary:     Effort: Pulmonary effort is normal.  Musculoskeletal:        General: Normal range of motion.     Cervical back: Normal range of  motion and neck supple.     Comments: Right hand with tenderness to palpation to base of middle finger Normal range of motion of right hand and fingers No tenderness to palpation to wrist  Skin:    General: Skin is warm and dry.  Neurological:     Mental Status: She is alert and oriented to person, place, and time.     Comments: Grip strength slightly reduced in right hand compared to left Normal sensation in b/l hands  Psychiatric:        Attention and Perception: Attention and perception normal.        Mood and Affect: Mood normal.        Behavior: Behavior normal.        Thought Content: Thought content normal.        Judgment: Judgment normal.     Assessment and Plan:   1. Right hand pain (Primary) - Ambulatory referral to Sports Medicine   Suspect osteoarthritis Possible trigger finger She is agreeable to sports medicine for further evaluation -- placed today Continue aleve as needed   I, Isabelle Course, acting as a Neurosurgeon for Jarold Motto, [place]., have documented all relevant documentation on the behalf of Jarold Motto, [place], as directed by  Jarold Motto, PA while in the presence of Jarold Motto, [place].  I, Jarold Motto, [place], have reviewed all documentation for this visit. The documentation on 11/08/23 for the exam, diagnosis, procedures, and orders are all accurate and complete.  Jarold Motto, PA-C

## 2023-11-20 ENCOUNTER — Encounter: Payer: Self-pay | Admitting: Family Medicine

## 2023-11-20 ENCOUNTER — Ambulatory Visit (INDEPENDENT_AMBULATORY_CARE_PROVIDER_SITE_OTHER): Admitting: Family Medicine

## 2023-11-20 ENCOUNTER — Other Ambulatory Visit: Payer: Self-pay

## 2023-11-20 VITALS — BP 120/82 | HR 78 | Ht 62.0 in | Wt 148.0 lb

## 2023-11-20 DIAGNOSIS — M65331 Trigger finger, right middle finger: Secondary | ICD-10-CM

## 2023-11-20 NOTE — Patient Instructions (Addendum)
 Thank you for coming in today.   You received an injection today. Seek immediate medical attention if the joint becomes red, extremely painful, or is oozing fluid.   Band-Aid Splint for the finger.  See you back as needed.

## 2023-11-20 NOTE — Progress Notes (Signed)
   I, Tracey Moody, CMA acting as a scribe for Garlan Juniper, MD.  Tracey Moody is a 68 y.o. female who presents to [facility] at [facility] today for R hand pain. Pt was previously seen by Dr. Alease Hunter in July 2024 for R-sided rib pain.  Today, pt c/o R hand pain ongoing since December. Pt locates pain to 4th and 5th fingers, getting stuck. Also notes swelling in the 3rd finger causing difficulty with ROM. Occasionally, pain will radiate into the palm of the hand and the wrist. No relief with Aleve, Voltaren seemed to exacerbate sx. Has tried message and exercises provided by her daughter who works as a Adult nurse. Notes TTP at base of the middle finger. Had eval with PCP on 11/08/23.   Swelling: present Radiates: palm and wrist Paresthesia: normal sensations bilaterally Grip strength: decreased d/t swelling Aggravates: gripping, grasping Treatments tried: naproxen, Voltaren gel, hand exercises per daughter PT, massage  Pertinent review of systems: No fevers or chills  Relevant historical information: Osteoporosis.   Exam:  BP 120/82   Pulse 78   Ht 5\' 2"  (1.575 m)   Wt 148 lb (67.1 kg)   SpO2 99%   BMI 27.07 kg/m  General: Well Developed, well nourished, and in no acute distress.   MSK: Right hand some swelling at third MCP palmar aspect.  Generally mild degenerative changes across both hands.  Intact strength.  Limited range of motion flexion.  PIP.    Lab and Radiology Results 0 Procedure: Real-time Ultrasound Guided Injection of right hand third MCP A1 pulley tendon sheath injection.  Trigger finger injection. Device: Philips Affiniti 50G/GE Logiq Images permanently stored and available for review in PACS Verbal informed consent obtained.  Discussed risks and benefits of procedure. Warned about infection, bleeding, hyperglycemia damage to structures among others. Patient expresses understanding and agreement Time-out conducted.   Noted no  overlying erythema, induration, or other signs of local infection.   Skin prepped in a sterile fashion.   Local anesthesia: Topical Ethyl chloride.   With sterile technique and under real time ultrasound guidance: 40 mg of Kenalog and 1 mL of lidocaine injected into tendon sheath at A1 pulley. Fluid seen entering the tendon sheath.   Completed without difficulty   Pain immediately resolved suggesting accurate placement of the medication.   Advised to call if fevers/chills, erythema, induration, drainage, or persistent bleeding.   Images permanently stored and available for review in the ultrasound unit.  Impression: Technically successful ultrasound guided injection.         Assessment and Plan: 68 y.o. female with right hand trigger finger involving third digit.  Plan for injection today.  Plan to continue double Band-Aid splint. Recheck back as needed.  PDMP not reviewed this encounter. Orders Placed This Encounter  Procedures   US  LIMITED JOINT SPACE STRUCTURES UP RIGHT(NO LINKED CHARGES)    Reason for Exam (SYMPTOM  OR DIAGNOSIS REQUIRED):   right hand pain    Preferred imaging location?:   [facility]   No orders of the defined types were placed in this encounter.    Discussed warning signs or symptoms. Please see discharge instructions. Patient expresses understanding.   The above documentation has been reviewed and is accurate and complete Garlan Juniper, M.D.

## 2023-12-09 ENCOUNTER — Other Ambulatory Visit: Payer: Self-pay | Admitting: Physician Assistant

## 2023-12-10 ENCOUNTER — Other Ambulatory Visit: Payer: Self-pay

## 2024-03-09 ENCOUNTER — Other Ambulatory Visit: Payer: Self-pay | Admitting: Physician Assistant

## 2024-03-19 NOTE — Progress Notes (Unsigned)
   LILLETTE Ileana Collet, PhD, LAT, ATC acting as a scribe for Artist Lloyd, MD.  Tracey Moody is a 69 y.o. female who presents to [facility] at [facility] today for L hand pain. Pt was previously seen by Dr. Lloyd on 11/20/23 for R 3rd trigger finger.  Today, pt c/o L hand pain x ***. Pt locates pain to ****  Grip strength: Aggravates: Treatments tried:  Pertinent review of systems: ***  Relevant historical information: ***   Exam:  There were no vitals taken for this visit. General: Well Developed, well nourished, and in no acute distress.   MSK: ***    Lab and Radiology Results No results found for this or any previous visit (from the past 72 hours). No results found.     Assessment and Plan: 68 y.o. female with ***   PDMP not reviewed this encounter. No orders of the defined types were placed in this encounter.  No orders of the defined types were placed in this encounter.    Discussed warning signs or symptoms. Please see discharge instructions. Patient expresses understanding.   ***

## 2024-03-21 ENCOUNTER — Other Ambulatory Visit: Payer: Self-pay

## 2024-03-21 ENCOUNTER — Encounter: Payer: Self-pay | Admitting: Family Medicine

## 2024-03-21 ENCOUNTER — Ambulatory Visit: Admitting: Family Medicine

## 2024-03-21 VITALS — BP 112/82 | HR 65 | Ht 62.0 in | Wt 146.0 lb

## 2024-03-21 DIAGNOSIS — M79642 Pain in left hand: Secondary | ICD-10-CM

## 2024-03-21 DIAGNOSIS — M65312 Trigger thumb, left thumb: Secondary | ICD-10-CM

## 2024-03-21 DIAGNOSIS — M6530 Trigger finger, unspecified finger: Secondary | ICD-10-CM | POA: Insufficient documentation

## 2024-03-21 NOTE — Patient Instructions (Addendum)
 Thank you for coming in today.   You received an injection today. Seek immediate medical attention if the joint becomes red, extremely painful, or is oozing fluid.   See you back as needed.

## 2024-04-09 ENCOUNTER — Encounter: Payer: Self-pay | Admitting: Physician Assistant

## 2024-06-14 ENCOUNTER — Other Ambulatory Visit: Payer: Self-pay | Admitting: Physician Assistant

## 2024-07-14 ENCOUNTER — Other Ambulatory Visit: Payer: Self-pay | Admitting: Medical Genetics

## 2024-07-18 LAB — HM MAMMOGRAPHY

## 2024-07-21 ENCOUNTER — Ambulatory Visit (INDEPENDENT_AMBULATORY_CARE_PROVIDER_SITE_OTHER): Payer: Medicare Other | Admitting: Physician Assistant

## 2024-07-21 ENCOUNTER — Encounter: Payer: Self-pay | Admitting: Physician Assistant

## 2024-07-21 ENCOUNTER — Ambulatory Visit: Payer: Self-pay | Admitting: Physician Assistant

## 2024-07-21 VITALS — BP 136/64 | HR 81 | Temp 97.6°F | Ht 61.61 in | Wt 146.2 lb

## 2024-07-21 DIAGNOSIS — M79641 Pain in right hand: Secondary | ICD-10-CM | POA: Diagnosis not present

## 2024-07-21 DIAGNOSIS — E785 Hyperlipidemia, unspecified: Secondary | ICD-10-CM | POA: Diagnosis not present

## 2024-07-21 DIAGNOSIS — M816 Localized osteoporosis [Lequesne]: Secondary | ICD-10-CM

## 2024-07-21 DIAGNOSIS — R519 Headache, unspecified: Secondary | ICD-10-CM

## 2024-07-21 DIAGNOSIS — M79642 Pain in left hand: Secondary | ICD-10-CM

## 2024-07-21 DIAGNOSIS — E2839 Other primary ovarian failure: Secondary | ICD-10-CM

## 2024-07-21 DIAGNOSIS — E559 Vitamin D deficiency, unspecified: Secondary | ICD-10-CM | POA: Diagnosis not present

## 2024-07-21 DIAGNOSIS — Z1211 Encounter for screening for malignant neoplasm of colon: Secondary | ICD-10-CM

## 2024-07-21 LAB — LIPID PANEL
Cholesterol: 177 mg/dL (ref 28–200)
HDL: 69.8 mg/dL
LDL Cholesterol: 93 mg/dL (ref 10–99)
NonHDL: 107.28
Total CHOL/HDL Ratio: 3
Triglycerides: 70 mg/dL (ref 10.0–149.0)
VLDL: 14 mg/dL (ref 0.0–40.0)

## 2024-07-21 LAB — CBC WITH DIFFERENTIAL/PLATELET
Basophils Absolute: 0.1 K/uL (ref 0.0–0.1)
Basophils Relative: 1.1 % (ref 0.0–3.0)
Eosinophils Absolute: 0.1 K/uL (ref 0.0–0.7)
Eosinophils Relative: 2.5 % (ref 0.0–5.0)
HCT: 39.6 % (ref 36.0–46.0)
Hemoglobin: 13.4 g/dL (ref 12.0–15.0)
Lymphocytes Relative: 41.9 % (ref 12.0–46.0)
Lymphs Abs: 2.3 K/uL (ref 0.7–4.0)
MCHC: 33.8 g/dL (ref 30.0–36.0)
MCV: 93.4 fl (ref 78.0–100.0)
Monocytes Absolute: 0.3 K/uL (ref 0.1–1.0)
Monocytes Relative: 4.5 % (ref 3.0–12.0)
Neutro Abs: 2.8 K/uL (ref 1.4–7.7)
Neutrophils Relative %: 50 % (ref 43.0–77.0)
Platelets: 245 K/uL (ref 150.0–400.0)
RBC: 4.24 Mil/uL (ref 3.87–5.11)
RDW: 13.3 % (ref 11.5–15.5)
WBC: 5.6 K/uL (ref 4.0–10.5)

## 2024-07-21 LAB — VITAMIN D 25 HYDROXY (VIT D DEFICIENCY, FRACTURES): VITD: 34.82 ng/mL (ref 30.00–100.00)

## 2024-07-21 LAB — COMPREHENSIVE METABOLIC PANEL WITH GFR
ALT: 17 U/L (ref 3–35)
AST: 18 U/L (ref 5–37)
Albumin: 4.5 g/dL (ref 3.5–5.2)
Alkaline Phosphatase: 61 U/L (ref 39–117)
BUN: 21 mg/dL (ref 6–23)
CO2: 29 meq/L (ref 19–32)
Calcium: 9.5 mg/dL (ref 8.4–10.5)
Chloride: 103 meq/L (ref 96–112)
Creatinine, Ser: 0.57 mg/dL (ref 0.40–1.20)
GFR: 92.94 mL/min
Glucose, Bld: 84 mg/dL (ref 70–99)
Potassium: 4.6 meq/L (ref 3.5–5.1)
Sodium: 140 meq/L (ref 135–145)
Total Bilirubin: 0.7 mg/dL (ref 0.2–1.2)
Total Protein: 7.1 g/dL (ref 6.0–8.3)

## 2024-07-21 NOTE — Progress Notes (Signed)
 "  Subjective:    Tracey Moody is a 68 y.o. female and is here for a comprehensive physical exam.  HPI  Health Maintenance Due  Topic Date Due   Colonoscopy  09/22/2013   Medicare Annual Wellness (AWV)  06/26/2024   Bone Density Scan  07/17/2024   Mammogram  07/17/2024    Discussed the use of AI scribe software for clinical note transcription with the patient, who gave verbal consent to proceed.  History of Present Illness   Tracey Moody is a 68 year old female who presents for follow-up on trigger finger and osteopenia management.  She has ongoing issues related to prior trigger fingers. Cortisone injections last year helped. She now has morning finger soreness with swelling and stiffness, worse when idle, with reduced grip strength and pain with pressure, although the fingers are not currently triggering.  She has osteopenia and is due for a bone density test. She does weight-bearing exercise but has been less consistent recently because of travel and weather.  She had two recent severe headaches that woke her from sleep, which resolved with Aleve during a recent cold while she was using Advil Cold and Sinus and Mucinex nasal spray.  She tolerates Lipitor without myalgias and links her gastrointestinal discomfort more to dairy than to the medication. She takes CoQ10 and vitamin D  regularly.  She had a fall in August with a large leg bruise and residual discoloration but no ongoing dizziness, numbness, or tingling.      Health Maintenance: Immunizations -- utd Colonoscopy -- overdue, interested in cologuard Mammogram -- UpToDate  PAP -- N/A  Bone Density -- overdue - ordered today Diet -- healthy Exercise -- as able  Sleep habits -- no major concerns Mood -- stable  UTD with dentist? - yes UTD with eye doctor? - yes  Weight history: Wt Readings from Last 10 Encounters:  07/21/24 146 lb 3.2 oz (66.3 kg)  03/21/24 146 lb (66.2 kg)  11/20/23 148  lb (67.1 kg)  11/08/23 146 lb 3.2 oz (66.3 kg)  07/19/23 145 lb 6.1 oz (65.9 kg)  06/27/23 146 lb (66.2 kg)  12/26/22 148 lb 12.8 oz (67.5 kg)  12/19/22 149 lb (67.6 kg)  07/17/22 147 lb 6.1 oz (66.9 kg)  06/20/22 147 lb (66.7 kg)   Body mass index is 27.08 kg/m. No LMP recorded. Patient is postmenopausal.  Alcohol use:  reports current alcohol use.  Tobacco use:  Tobacco Use: Low Risk (07/21/2024)   Patient History    Smoking Tobacco Use: Never    Smokeless Tobacco Use: Never    Passive Exposure: Not on file   Eligible for lung cancer screening? no     07/19/2023   10:26 AM  Depression screen PHQ 2/9  Decreased Interest 0  Down, Depressed, Hopeless 0  PHQ - 2 Score 0  Altered sleeping 1  Tired, decreased energy 1  Change in appetite 0  Feeling bad or failure about yourself  0  Trouble concentrating 0  Moving slowly or fidgety/restless 0  Suicidal thoughts 0  PHQ-9 Score 2   Difficult doing work/chores Not difficult at all     Data saved with a previous flowsheet row definition     Other providers/specialists: Patient Care Team: Job Lukes, [place] as PCP - General (Physician Assistant) Ob/Gyn, [facility] as Consulting Physician (Obstetrics and Gynecology) Joane Artist RAMAN, MD as Consulting Physician (Sports Medicine)    PMHx, SurgHx, SocialHx, Medications, and Allergies were reviewed in  the Visit Navigator and updated as appropriate.   Past Medical History:  Diagnosis Date   GERD (gastroesophageal reflux disease)    Hyperlipidemia      Past Surgical History:  Procedure Laterality Date   APPENDECTOMY  31   CESAREAN SECTION  1979, 1980, 1983, 1984, 1987   TONSILLECTOMY  1960     Family History  Problem Relation Age of Onset   Lung cancer Mother 73   Osteoporosis Mother    CAD Mother    Cancer Mother    Skin cancer Father    Hypertension Father    Alcohol abuse Father    Heart disease Father    Hypercholesterolemia Sister    Uterine  cancer Sister    Heart attack Sister    Hypercholesterolemia Sister    COPD Sister    Hypercholesterolemia Sister    Hypercholesterolemia Sister    Hypercholesterolemia Brother    Hypercholesterolemia Brother    Hypercholesterolemia Brother    Other Brother 22       MVA   CVA Maternal Grandmother    Alzheimer's disease Maternal Grandmother    Breast cancer Neg Hx    Colon cancer Neg Hx    Colon polyps Neg Hx    Esophageal cancer Neg Hx    Stomach cancer Neg Hx    Rectal cancer Neg Hx     Social History[1]  Review of Systems:   Review of Systems  Constitutional:  Negative for chills, fever, malaise/fatigue and weight loss.  HENT:  Negative for hearing loss, sinus pain and sore throat.   Respiratory:  Negative for cough and hemoptysis.   Cardiovascular:  Negative for chest pain, palpitations, leg swelling and PND.  Gastrointestinal:  Negative for abdominal pain, constipation, diarrhea, heartburn, nausea and vomiting.  Genitourinary:  Negative for dysuria, frequency and urgency.  Musculoskeletal:  Negative for back pain, myalgias and neck pain.  Skin:  Negative for itching and rash.  Neurological:  Positive for headaches. Negative for dizziness, tingling and seizures.  Endo/Heme/Allergies:  Negative for polydipsia.  Psychiatric/Behavioral:  Negative for depression. The patient is not nervous/anxious.     Objective:   BP 136/64 (BP Location: Left Arm, Patient Position: Sitting, Cuff Size: Normal)   Pulse 81   Temp 97.6 F (36.4 C) (Temporal)   Ht 5' 1.61 (1.565 m)   Wt 146 lb 3.2 oz (66.3 kg)   SpO2 98%   BMI 27.08 kg/m  Body mass index is 27.08 kg/m.   General Appearance:    Alert, cooperative, no distress, appears stated age  Head:    Normocephalic, without obvious abnormality, atraumatic  Eyes:    PERRL, conjunctiva/corneas clear, EOM's intact, fundi    benign, both eyes  Ears:    Normal TM's and external ear canals, both ears  Nose:   Nares normal, septum  midline, mucosa normal, no drainage    or sinus tenderness  Throat:   Lips, mucosa, and tongue normal; teeth and gums normal  Neck:   Supple, symmetrical, trachea midline, no adenopathy;    thyroid:  no enlargement/tenderness/nodules; no carotid   bruit or JVD  Back:     Symmetric, no curvature, ROM normal, no CVA tenderness  Lungs:     Clear to auscultation bilaterally, respirations unlabored  Chest Wall:    No tenderness or deformity   Heart:    Regular rate and rhythm, S1 and S2 normal, no murmur, rub or gallop  Breast Exam:    Deferred  Abdomen:  Soft, non-tender, bowel sounds active all four quadrants,    no masses, no organomegaly  Genitalia:    Deferred  Extremities:   Extremities normal, atraumatic, no cyanosis or edema  Pulses:   2+ and symmetric all extremities  Skin:   Skin color, texture, turgor normal, no rashes or lesions  Lymph nodes:   Cervical, supraclavicular, and axillary nodes normal  Neurologic:   CNII-XII intact, normal strength, sensation and reflexes    throughout    Assessment/Plan:   Assessment and Plan    Bilateral hand pain Chronic bilateral hand pain with trigger finger. Previous cortisone injections provided relief. Current symptoms include soreness and stiffness, particularly in the morning, without triggering. No current need for further injections unless symptoms worsen. - Consult with physical therapist in January for hand exercises and management.  Localized osteoporosis without current pathological fracture; Estrogen deficiency Managed with weight-bearing exercises. Plans to increase physical activity with a new gym membership that includes a pool. - Encouraged weight-bearing exercises. - Plan to join a gym with a pool for increased physical activity. - Considering Prolia -- will await results of DEXA   Hyperlipidemia Managed with Lipitor. No significant side effects reported. Occasional gastrointestinal discomfort likely related to dairy  intolerance rather than Lipitor. - Continue Lipitor 80 mg   Vitamin D  deficiency Managed with supplementation. No side effects reported from vitamin D  intake. - Continue vitamin D  supplementation.     Nonintractable headache, unspecified chronicity pattern, unspecified headache type  Unclear etiology Isolated event during use of sudafed Offered imaging - she declined - no ongoing symptom(s) or other symptom(s) Low threshold to obtain further work-up  If sudden onset headache(s), dizziness -- needs to go to the ER  Lucie Buttner, PA-C [facility]           [1]  Social History Tobacco Use   Smoking status: Never   Smokeless tobacco: Never  Vaping Use   Vaping status: Never Used  Substance Use Topics   Alcohol use: Yes    Comment: 1 beer every 2-3 months   Drug use: No   "

## 2024-07-22 ENCOUNTER — Encounter: Payer: Self-pay | Admitting: Physician Assistant

## 2024-08-01 ENCOUNTER — Encounter: Payer: Self-pay | Admitting: Physician Assistant

## 2024-08-01 LAB — COLOGUARD: COLOGUARD: NEGATIVE

## 2024-08-25 ENCOUNTER — Ambulatory Visit

## 2024-08-25 VITALS — Ht 62.0 in | Wt 146.0 lb

## 2024-08-25 DIAGNOSIS — Z0000 Encounter for general adult medical examination without abnormal findings: Secondary | ICD-10-CM | POA: Diagnosis not present

## 2024-08-25 NOTE — Patient Instructions (Addendum)
 Tracey Moody,  Thank you for taking the time for your Medicare Wellness Visit. I appreciate your continued commitment to your health goals. Please review the care plan we discussed, and feel free to reach out if I can assist you further.  Please note that Annual Wellness Visits do not include a physical exam. Some assessments may be limited, especially if the visit was conducted virtually. If needed, we may recommend an in-person follow-up with your provider.  Ongoing Care Seeing your primary care provider every 3 to 6 months helps us  monitor your health and provide consistent, personalized care.   Referrals If a referral was made during today's visit and you haven't received any updates within two weeks, please contact the referred provider directly to check on the status.  Recommended Screenings:  Health Maintenance  Topic Date Due   Osteoporosis screening with Bone Density Scan  07/17/2024   DTaP/Tdap/Td vaccine (2 - Td or Tdap) 07/21/2025*   Breast Cancer Screening  07/18/2025   Medicare Annual Wellness Visit  08/25/2025   Cologuard (Stool DNA test)  07/29/2027   Pneumococcal Vaccine for age over 29  Completed   Flu Shot  Completed   Hepatitis C Screening  Completed   Zoster (Shingles) Vaccine  Completed   Meningitis B Vaccine  Aged Out   Colon Cancer Screening  Discontinued   COVID-19 Vaccine  Discontinued  *Topic was postponed. The date shown is not the original due date.       06/27/2023    9:13 AM  Advanced Directives  Does Patient Have a Medical Advance Directive? Yes  Type of Advance Directive Living will;Healthcare Power of Attorney  Does patient want to make changes to medical advance directive? No - Patient declined  Copy of Healthcare Power of Attorney in Chart? No - copy requested    Vision: Annual vision screenings are recommended for early detection of glaucoma, cataracts, and diabetic retinopathy. These exams can also reveal signs of chronic conditions  such as diabetes and high blood pressure.  Dental: Annual dental screenings help detect early signs of oral cancer, gum disease, and other conditions linked to overall health, including heart disease and diabetes.  Please see the attached documents for additional preventive care recommendations. You have an order for:  []   2D Mammogram  []   3D Mammogram  [x]   Bone Density     Please call for appointment: Order has been placed  [name] mammography  [address]  [place] , [place] [address]

## 2024-08-25 NOTE — Progress Notes (Signed)
 "  Chief Complaint  Patient presents with   Medicare Wellness     Subjective:   Tracey Moody is a 69 y.o. female who presents for a Medicare Annual Wellness Visit.  Visit info / Clinical Intake: Medicare Wellness Visit Type:: Subsequent Annual Wellness Visit Persons participating in visit and providing information:: patient Medicare Wellness Visit Mode:: Telephone If telephone:: video declined Since this visit was completed virtually, some vitals may be partially provided or unavailable. Missing vitals are due to the limitations of the virtual format.: Unable to obtain vitals - no equipment If Telephone or Video please confirm:: I connected with patient using audio/video enable telemedicine. I verified patient identity with two identifiers, discussed telehealth limitations, and patient agreed to proceed. Patient Location:: home Provider Location:: office Interpreter Needed?: No Pre-visit prep was completed: yes AWV questionnaire completed by patient prior to visit?: yes Date:: 08/25/24 Living arrangements:: (Patient-Rptd) lives with spouse/significant other Patient's Overall Health Status Rating: (Patient-Rptd) very good Typical amount of pain: (Patient-Rptd) some Does pain affect daily life?: (Patient-Rptd) no Are you currently prescribed opioids?: no  Dietary Habits and Nutritional Risks How many meals a day?: (Patient-Rptd) 2 Eats fruit and vegetables daily?: (Patient-Rptd) yes Most meals are obtained by: (Patient-Rptd) preparing own meals In the last 2 weeks, have you had any of the following?: none Diabetic:: no  Functional Status Activities of Daily Living (to include ambulation/medication): (Patient-Rptd) Independent Ambulation: Independent with device- listed below Home Assistive Devices/Equipment: Eyeglasses Medication Administration: (Patient-Rptd) Independent Home Management (perform basic housework or laundry): (Patient-Rptd) Independent Manage your own  finances?: (Patient-Rptd) yes Primary transportation is: (Patient-Rptd) driving Concerns about vision?: no *vision screening is required for WTM* Concerns about hearing?: no  Fall Screening Falls in the past year?: (Patient-Rptd) 1 Number of falls in past year: (Patient-Rptd) 0 Was there an injury with Fall?: 1 (bruise on leg) Fall Risk Category Calculator: (Patient-Rptd) 2 Patient Fall Risk Level: (Patient-Rptd) Moderate Fall Risk  Fall Risk Patient at Risk for Falls Due to: History of fall(s) Fall risk Follow up: Falls evaluation completed  Home and Transportation Safety: All rugs have non-skid backing?: (Patient-Rptd) yes All stairs or steps have railings?: (Patient-Rptd) N/A, no stairs Grab bars in the bathtub or shower?: (!) (Patient-Rptd) no Have non-skid surface in bathtub or shower?: (!) (Patient-Rptd) no Good home lighting?: (Patient-Rptd) yes Regular seat belt use?: (Patient-Rptd) yes Hospital stays in the last year:: (Patient-Rptd) no  Cognitive Assessment Difficulty concentrating, remembering, or making decisions? : (Patient-Rptd) no Will 6CIT or Mini Cog be Completed: yes What year is it?: 0 points What month is it?: 0 points Give patient an address phrase to remember (5 components): [address] [place]  About what time is it?: 0 points Count backwards from 20 to 1: 0 points Say the months of the year in reverse: 0 points Repeat the address phrase from earlier: 0 points 6 CIT Score: 0 points  Advance Directives (For Healthcare) Does Patient Have a Medical Advance Directive?: Yes Type of Advance Directive: Living will Copy of Living Will in Chart?: Yes - validated most recent copy scanned in chart (See row information)  Reviewed/Updated  Reviewed/Updated: Reviewed All (Medical, Surgical, Family, Medications, Allergies, Care Teams, Patient Goals)    Allergies (verified) Patient has no known allergies.   Current Medications (verified) Outpatient  Encounter Medications as of 08/25/2024  Medication Sig   aspirin EC 81 MG tablet Take 81 mg by mouth daily.   atorvastatin  (LIPITOR) 80 MG tablet Take 1 tablet (80 mg  total) by mouth daily.   b complex vitamins tablet Take 1 tablet by mouth daily.   Baclofen  5 MG TABS TAKE 1 TABLET BY MOUTH DAILY AS NEEDED.   BIOTIN PO Take 1 tablet by mouth daily.   Calcium  Citrate (CITRACAL PO) Take 1,200 mg by mouth daily.    Cholecalciferol (VITAMIN D3) 2000 units TABS Take 1 tablet by mouth daily.   Echinacea 500 MG CAPS Take 2 capsules by mouth daily.   famotidine  (PEPCID ) 20 MG tablet TAKE 1 TABLET DAILY   naproxen sodium (ANAPROX) 220 MG tablet Take 220 mg by mouth as needed.   Omega-3 Fatty Acids (FISH OIL) 1000 MG CPDR Take 1 capsule by mouth daily.   Turmeric 500 MG CAPS Take 1 capsule by mouth daily.   No facility-administered encounter medications on file as of 08/25/2024.    History: Past Medical History:  Diagnosis Date   GERD (gastroesophageal reflux disease)    Hyperlipidemia    Past Surgical History:  Procedure Laterality Date   APPENDECTOMY  28   CESAREAN SECTION  1979, 16, 34, 1984, 1987   TONSILLECTOMY  1960   Family History  Problem Relation Age of Onset   Lung cancer Mother 23   Osteoporosis Mother    CAD Mother    Cancer Mother    Skin cancer Father    Hypertension Father    Alcohol abuse Father    Heart disease Father    Hypercholesterolemia Sister    Uterine cancer Sister    Cancer Sister    Heart attack Sister    Hypercholesterolemia Sister    COPD Sister    Hypercholesterolemia Sister    Hypercholesterolemia Sister    Hypercholesterolemia Brother    Hypercholesterolemia Brother    Hypercholesterolemia Brother    Other Brother 83       MVA   CVA Maternal Grandmother    Alzheimer's disease Maternal Grandmother    Breast cancer Neg Hx    Colon cancer Neg Hx    Colon polyps Neg Hx    Esophageal cancer Neg Hx    Stomach cancer Neg Hx    Rectal  cancer Neg Hx    Social History   Occupational History   Occupation: retired  Tobacco Use   Smoking status: Never   Smokeless tobacco: Never  Vaping Use   Vaping status: Never Used  Substance and Sexual Activity   Alcohol use: Yes    Comment: 1 beer every 2-3 months   Drug use: Never   Sexual activity: Yes    Birth control/protection: Post-menopausal    Comment: Husband Vasectomy   Tobacco Counseling Counseling given: Not Answered  SDOH Screenings   Food Insecurity: No Food Insecurity (08/25/2024)  Housing: Low Risk (08/25/2024)  Transportation Needs: No Transportation Needs (08/25/2024)  Utilities: Not At Risk (08/25/2024)  Alcohol Screen: Low Risk (08/25/2024)  Depression (PHQ2-9): Low Risk (08/25/2024)  Financial Resource Strain: Low Risk (08/25/2024)  Physical Activity: Insufficiently Active (08/25/2024)  Social Connections: Moderately Integrated (08/25/2024)  Stress: No Stress Concern Present (08/25/2024)  Tobacco Use: Low Risk (08/25/2024)  Health Literacy: Adequate Health Literacy (08/25/2024)   See flowsheets for full screening details  Depression Screen PHQ 2 & 9 Depression Scale- Over the past 2 weeks, how often have you been bothered by any of the following problems? Little interest or pleasure in doing things: 0 Feeling down, depressed, or hopeless (PHQ Adolescent also includes...irritable): 0 PHQ-2 Total Score: 0     Goals Addressed  This Visit's Progress     more exercise (pt-stated)               Objective:    Today's Vitals   08/25/24 1020  Weight: 146 lb (66.2 kg)  Height: 5' 2 (1.575 m)   Body mass index is 26.7 kg/m.  Hearing/Vision screen No results found. Immunizations and Health Maintenance Health Maintenance  Topic Date Due   Bone Density Scan  07/17/2024   DTaP/Tdap/Td (2 - Td or Tdap) 07/21/2025 (Originally 09/19/2023)   Mammogram  07/18/2025   Medicare Annual Wellness (AWV)  08/25/2025   Fecal DNA (Cologuard)   07/29/2027   Pneumococcal Vaccine: 50+ Years  Completed   Influenza Vaccine  Completed   Hepatitis C Screening  Completed   Zoster Vaccines- Shingrix   Completed   Meningococcal B Vaccine  Aged Out   Colonoscopy  Discontinued   COVID-19 Vaccine  Discontinued        Assessment/Plan:  This is a routine wellness examination for Rickita.  Patient Care Team: Job Lukes, [place] as PCP - General (Physician Assistant) Ob/Gyn, [facility] as Consulting Physician (Obstetrics and Gynecology) Joane Artist RAMAN, MD as Consulting Physician (Sports Medicine) Raelyn Harlene DEL, OD (Optometry)  I have personally reviewed and noted the following in the patients chart:   Medical and social history Use of alcohol, tobacco or illicit drugs  Current medications and supplements including opioid prescriptions. Functional ability and status Nutritional status Physical activity Advanced directives List of other physicians Hospitalizations, surgeries, and ER visits in previous 12 months Vitals Screenings to include cognitive, depression, and falls Referrals and appointments  No orders of the defined types were placed in this encounter.  In addition, I have reviewed and discussed with patient certain preventive protocols, quality metrics, and best practice recommendations. A written personalized care plan for preventive services as well as general preventive health recommendations were provided to patient.   Ellouise DEL Haws, LPN   [id number]/73/7973   Return in about 1 year (around 08/25/2025).  After Visit Summary: (MyChart) Due to this being a telephonic visit, the after visit summary with patients personalized plan was offered to patient via MyChart   Nurse Notes: No voiced or noted concerns at this time "

## 2024-08-26 ENCOUNTER — Other Ambulatory Visit

## 2024-09-08 ENCOUNTER — Other Ambulatory Visit

## 2025-08-27 ENCOUNTER — Ambulatory Visit
